# Patient Record
Sex: Male | Born: 1960 | Hispanic: No | Marital: Married | Smoking: Former smoker
Health system: Southern US, Community
[De-identification: ages and names within clinical notes are randomized; demographics above are authoritative.]

## PROBLEM LIST (undated history)

## (undated) ENCOUNTER — Ambulatory Visit (HOSPITAL_COMMUNITY): Admission: EM | Payer: Self-pay

## (undated) HISTORY — PX: WRIST SURGERY: SHX841

## (undated) HISTORY — PX: FRACTURE SURGERY: SHX138

---

## 1999-08-28 HISTORY — PX: PROSTATE SURGERY: SHX751

## 1999-10-09 ENCOUNTER — Ambulatory Visit (HOSPITAL_COMMUNITY): Admission: RE | Admit: 1999-10-09 | Discharge: 1999-10-09 | Payer: Self-pay | Admitting: Urology

## 2000-03-25 ENCOUNTER — Emergency Department (HOSPITAL_COMMUNITY): Admission: EM | Admit: 2000-03-25 | Discharge: 2000-03-25 | Payer: Self-pay

## 2002-05-29 ENCOUNTER — Emergency Department (HOSPITAL_COMMUNITY): Admission: EM | Admit: 2002-05-29 | Discharge: 2002-05-29 | Payer: Self-pay | Admitting: *Deleted

## 2011-06-04 ENCOUNTER — Other Ambulatory Visit: Payer: Self-pay | Admitting: Specialist

## 2011-06-04 ENCOUNTER — Ambulatory Visit
Admission: RE | Admit: 2011-06-04 | Discharge: 2011-06-04 | Disposition: A | Discharge status: Home / Self Care | Payer: No Typology Code available for payment source | Source: Ambulatory Visit | Attending: Specialist | Admitting: Specialist

## 2011-06-04 DIAGNOSIS — R6889 Other general symptoms and signs: Secondary | ICD-10-CM

## 2013-03-03 ENCOUNTER — Emergency Department (HOSPITAL_BASED_OUTPATIENT_CLINIC_OR_DEPARTMENT_OTHER): Payer: Worker's Compensation

## 2013-03-03 ENCOUNTER — Encounter (HOSPITAL_BASED_OUTPATIENT_CLINIC_OR_DEPARTMENT_OTHER): Payer: Self-pay | Admitting: *Deleted

## 2013-03-03 ENCOUNTER — Emergency Department (HOSPITAL_BASED_OUTPATIENT_CLINIC_OR_DEPARTMENT_OTHER)
Admission: EM | Admit: 2013-03-03 | Discharge: 2013-03-03 | Disposition: A | Discharge status: Home / Self Care | Payer: Worker's Compensation | Attending: Emergency Medicine | Admitting: Emergency Medicine

## 2013-03-03 DIAGNOSIS — S6990XA Unspecified injury of unspecified wrist, hand and finger(s), initial encounter: Secondary | ICD-10-CM | POA: Insufficient documentation

## 2013-03-03 DIAGNOSIS — T148XXA Other injury of unspecified body region, initial encounter: Secondary | ICD-10-CM

## 2013-03-03 DIAGNOSIS — S59909A Unspecified injury of unspecified elbow, initial encounter: Secondary | ICD-10-CM | POA: Insufficient documentation

## 2013-03-03 DIAGNOSIS — F17200 Nicotine dependence, unspecified, uncomplicated: Secondary | ICD-10-CM | POA: Insufficient documentation

## 2013-03-03 DIAGNOSIS — Y9289 Other specified places as the place of occurrence of the external cause: Secondary | ICD-10-CM | POA: Insufficient documentation

## 2013-03-03 DIAGNOSIS — S59919A Unspecified injury of unspecified forearm, initial encounter: Secondary | ICD-10-CM | POA: Insufficient documentation

## 2013-03-03 DIAGNOSIS — S6992XA Unspecified injury of left wrist, hand and finger(s), initial encounter: Secondary | ICD-10-CM

## 2013-03-03 DIAGNOSIS — Y9389 Activity, other specified: Secondary | ICD-10-CM | POA: Insufficient documentation

## 2013-03-03 DIAGNOSIS — S61409A Unspecified open wound of unspecified hand, initial encounter: Secondary | ICD-10-CM | POA: Insufficient documentation

## 2013-03-03 DIAGNOSIS — W241XXA Contact with transmission devices, not elsewhere classified, initial encounter: Secondary | ICD-10-CM | POA: Insufficient documentation

## 2013-03-03 DIAGNOSIS — Y990 Civilian activity done for income or pay: Secondary | ICD-10-CM | POA: Insufficient documentation

## 2013-03-03 MED ORDER — HYDROMORPHONE HCL PF 1 MG/ML IJ SOLN
1.0000 mg | Freq: Once | INTRAMUSCULAR | Status: AC
  Administered 2013-03-03: 1 mg via INTRAVENOUS
  Filled 2013-03-03: qty 1

## 2013-03-03 MED ORDER — BACITRACIN ZINC 500 UNIT/GM EX OINT: TOPICAL_OINTMENT | Freq: Two times a day (BID) | CUTANEOUS | Status: DC

## 2013-03-03 MED ORDER — OXYCODONE-ACETAMINOPHEN 5-325 MG PO TABS: 2.0000 | ORAL_TABLET | ORAL | Status: DC | PRN

## 2013-03-03 MED ORDER — BACITRACIN 500 UNIT/GM EX OINT
1.0000 "application " | TOPICAL_OINTMENT | Freq: Two times a day (BID) | CUTANEOUS | Status: DC
  Administered 2013-03-03: 1 via TOPICAL
  Filled 2013-03-03: qty 0.9

## 2013-03-03 NOTE — ED Provider Notes (Signed)
History    CSN: [id number] Arrival date & time 03/03/13  2002  First MD Initiated Contact with Patient 03/03/13 2039     Chief Complaint  Patient presents with  . Arm Injury   (Consider location/radiation/quality/duration/timing/severity/associated sxs/prior Treatment) Patient is a 52 y.o. male presenting with arm injury. The history is provided by the patient, medical records and a friend. No language interpreter was used.  Arm Injury Associated symptoms: no back pain, no fatigue and no fever     Adam Jacobs is a 52 y.o. male  with no medical history presents to the Emergency Department complaining of acute, persistent pain in the left hand, wrist and arm beginning approximately one hour ago after getting his left arm caught in a conveyor belt. Patient's employer states he bent over to pick up a box and uses left hand to steady himself was unaware that he was putting his hand on the belt. The belt pulled his hand down between the conveyor belt and the roller.  The employer states they stopped the belt and disassembled the roller to remove his hand. Associated symptoms include  searing pain in the left hand, although some of the skin of the left hand.  Nothing makes it better and the patient makes it worse.  Pt denies fever, chills, left elbow pain, left shoulder pain, neck pain, nausea, vomiting, loss of consciousness, neck or back pain.  Pt is right handed.     History reviewed. No pertinent past medical history. History reviewed. No pertinent past surgical history. No family history on file. History  Substance Use Topics  . Smoking status: Current Every Day Smoker -- 1.00 packs/day    Types: Cigarettes  . Smokeless tobacco: Not on file  . Alcohol Use: No    Review of Systems  Constitutional: Negative for fever, diaphoresis, appetite change, fatigue and unexpected weight change.  HENT: Negative for mouth sores and neck stiffness.   Eyes: Negative for visual disturbance.    Respiratory: Negative for cough, chest tightness, shortness of breath and wheezing.   Cardiovascular: Negative for chest pain.  Gastrointestinal: Negative for nausea, vomiting, abdominal pain, diarrhea and constipation.  Endocrine: Negative for polydipsia, polyphagia and polyuria.  Genitourinary: Negative for dysuria, urgency, frequency and hematuria.  Musculoskeletal: Positive for myalgias, joint swelling and arthralgias. Negative for back pain and gait problem.  Skin: Positive for wound. Negative for rash.  Allergic/Immunologic: Negative for immunocompromised state.  Neurological: Negative for syncope, light-headedness and headaches.  Hematological: Does not bruise/bleed easily.  Psychiatric/Behavioral: Negative for sleep disturbance. The patient is not nervous/anxious.     Allergies  Review of patient's allergies indicates no known allergies.  Home Medications   Current Outpatient Rx  Name  Route  Sig  Dispense  Refill  . bacitracin ointment   Topical   Apply topically 2 (two) times daily.   120 g   0   . oxyCODONE-acetaminophen (PERCOCET/ROXICET) 5-325 MG per tablet   Oral   Take 2 tablets by mouth every 4 (four) hours as needed for pain.   30 tablet   0    BP 130/104  Pulse 118  Temp(Src) 98 F (36.7 C) (Oral)  Resp 18  SpO2 100% Physical Exam  Nursing note and vitals reviewed. Constitutional: He is oriented to person, place, and time. He appears well-developed and well-nourished. No distress.  HENT:  Head: Normocephalic and atraumatic.  Eyes: Conjunctivae are normal.  Neck: Normal range of motion.  No cervical midline tenderness or paraspinal tenderness  Cardiovascular: Normal rate, regular rhythm, S1 normal, S2 normal, normal heart sounds and intact distal pulses.   Pulses:      Radial pulses are 2+ on the right side, and 2+ on the left side.       Dorsalis pedis pulses are 2+ on the right side, and 2+ on the left side.  Capillary refill < 3 sec   Pulmonary/Chest: Effort normal and breath sounds normal.  Musculoskeletal: He exhibits tenderness. He exhibits no edema.       Left shoulder: Normal.       Left elbow: Normal.       Left wrist: He exhibits decreased range of motion, tenderness, bony tenderness, swelling (mild) and laceration (avulsion).       Cervical back: Normal.       Thoracic back: Normal.       Lumbar back: Normal.       Arms:      Left hand: He exhibits decreased range of motion, tenderness, bony tenderness, laceration (avulsion) and swelling (mild). He exhibits normal two-point discrimination, normal capillary refill and no deformity. Normal sensation noted. Decreased strength noted.       Hands: ROM: Severely decreased range of motion of the left hand, fingers, wrist and forearm secondary to severe pain  Lymphadenopathy:    He has no cervical adenopathy.  Neurological: He is alert and oriented to person, place, and time. He exhibits normal muscle tone. Coordination normal. GCS eye subscore is 4. GCS verbal subscore is 5. GCS motor subscore is 6.  Sensation intact to dull and sharp in bilateral upper extremities Strength 1/5 secondary to severe pain in the LLE  Skin: Skin is warm and dry. He is not diaphoretic.  No tenting of the skin Avulsion of a large portion of the anterior and lateral wrist extending up to the left thumb and down into the forearm without visible tendons, no saturable laceration   Psychiatric: He has a normal mood and affect.    ED Course  Procedures (including critical care time) Labs Reviewed - No data to display Dg Forearm Left  03/03/2013   *RADIOLOGY REPORT*  Clinical Data: Left arm injury, arm was caught in a conveyor belt. Left wrist was mashed.  Skin avulsions to the thumb, wrist, and forearm.  Pain.  LEFT FOREARM - 2 VIEW  Comparison: None.  Findings: The left radius and ulna appear intact. No evidence of acute fracture or subluxation.  No focal bone lesions.  Bone matrix and cortex  appear intact.  No abnormal radiopaque densities in the soft tissues.  Soft tissue gas collections over the ulnar aspect of the wrist.  IMPRESSION: No displaced fractures demonstrated in the left radius or ulna.   Original Report Authenticated By: Burman Nieves, M.D.   Dg Wrist Complete Left  03/03/2013   *RADIOLOGY REPORT*  Clinical Data: Arm injury  LEFT WRIST - COMPLETE 3+ VIEW  Comparison: 03/03/2013  Findings: There is no evidence of fracture or dislocation.  There is no evidence of arthropathy or other focal bone abnormality. Soft tissues are unremarkable.  IMPRESSION:  1.  No acute findings.   Original Report Authenticated By: Signa Kell, M.D.   Dg Hand Complete Left  03/03/2013   *RADIOLOGY REPORT*  Clinical Data: Arm injury  LEFT HAND - COMPLETE 3+ VIEW  Comparison: None  Findings: There is no evidence of fracture or dislocation.  There is no evidence of arthropathy or other focal bone abnormality. Soft tissues are unremarkable.  IMPRESSION: Negative  exam.   Original Report Authenticated By: Signa Kell, M.D.   1. Hand injury, left, initial encounter   2. Skin avulsion     MDM  Joakim Jacobs presents with crush injury to the left arm. Patient right handed. Skin avulsion with no suturable laceration.  X-rays without acute fracture or dislocation noted in the left wrist, left hand and left forearm.  I personally reviewed the imaging tests through PACS system.  I reviewed available ER/hospitalization records through the EMR.  Pt avulsion cleaned and bandaged with bacitracin.  Wrist splint placed and pt given instructions to follow-up with Hand surgery this week for further evaluation.  Pt counseled to watch for possible compartment syndrome and return to the ED for worsening symptoms.  I have also discussed reasons to return immediately to the ER.  Patient expresses understanding and agrees with plan.   Dr. Karma Ganja was consulted and agrees with the plan.     Dahlia Client Payslee Bateson,  PA-C 03/03/13 2312

## 2013-03-03 NOTE — ED Notes (Signed)
PA at bedside now  

## 2013-03-03 NOTE — ED Notes (Signed)
Per pt and pt's supervisor at bedside, pt incidentally caught his left hand in a conveyor belt between the roller and the belt, pt sustained a superificial avulsion injury to left anterior hand/wrist - drainage from wound is minimal serosanguinous fluid, saline-moistened guaze applied to injury site, unable to cleanse wound at present d/t pain.
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
## 2013-03-03 NOTE — ED Provider Notes (Signed)
Medical screening examination/treatment/procedure(s) were performed by non-physician practitioner and as supervising physician I was immediately available for consultation/collaboration.  Natassja Ollis K Linker, MD 03/03/13 2329 

## 2014-04-05 IMAGING — CR DG WRIST COMPLETE 3+V*L*
4 series · 4 of 4 positions shown · non-contrast
Comparison: 03/03/2013

CLINICAL DATA: Arm injury

LEFT WRIST - COMPLETE 3+ VIEW

[x wrist pa left]
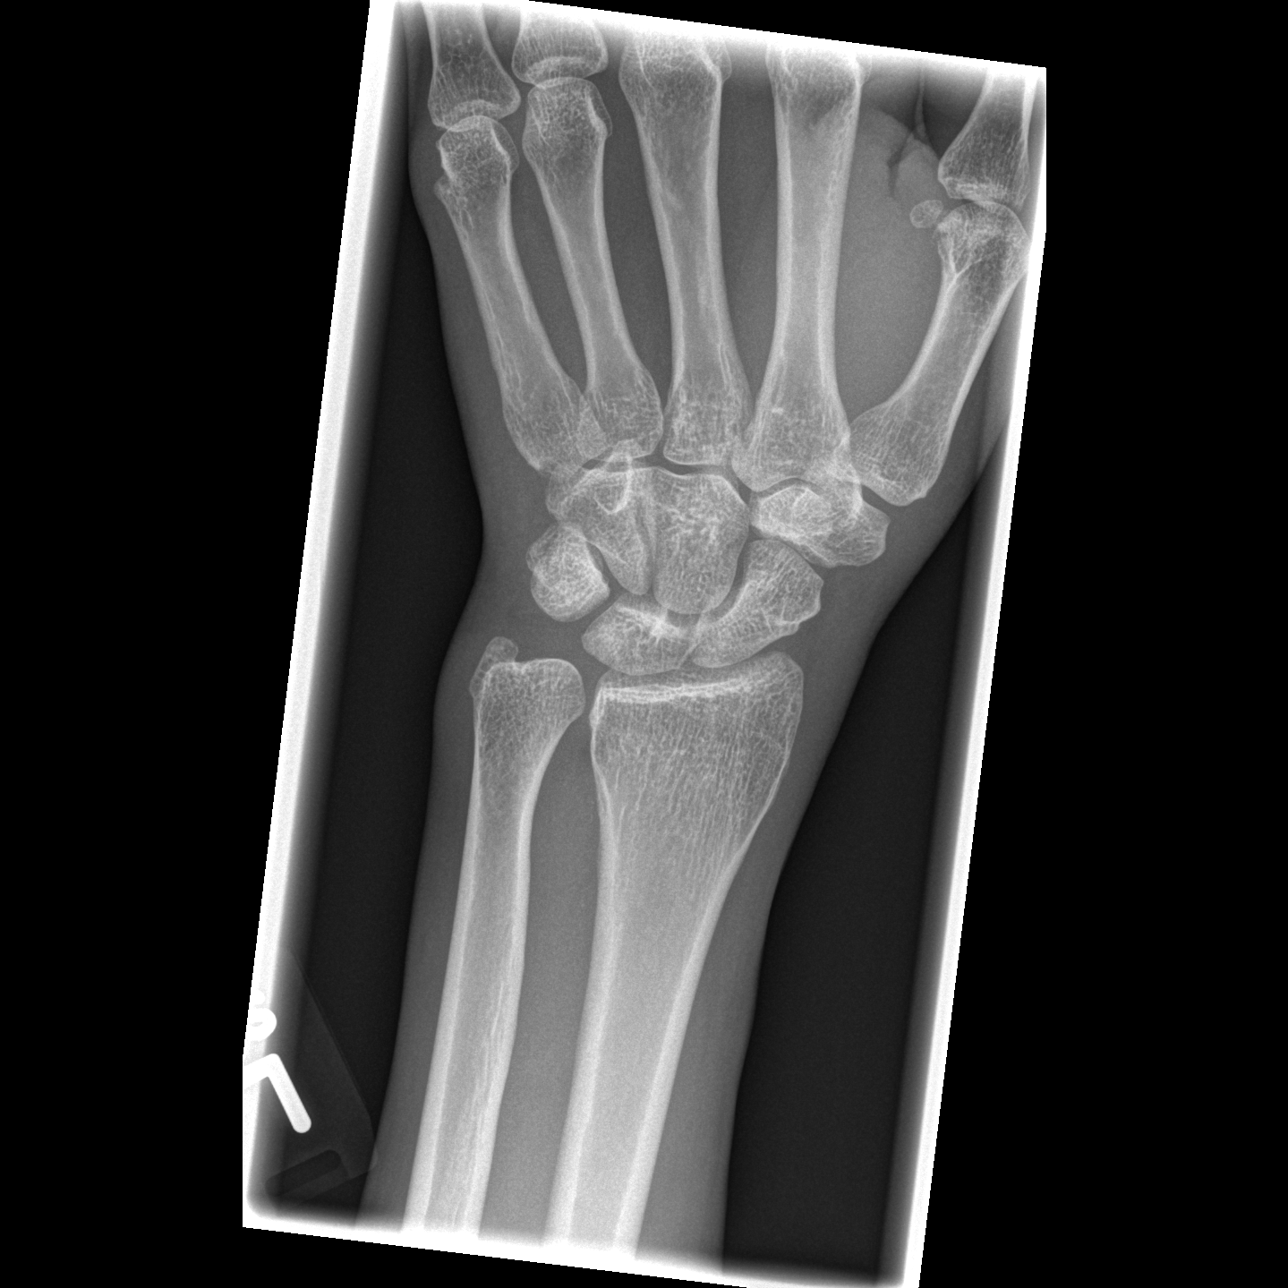

[x wrist obl left]
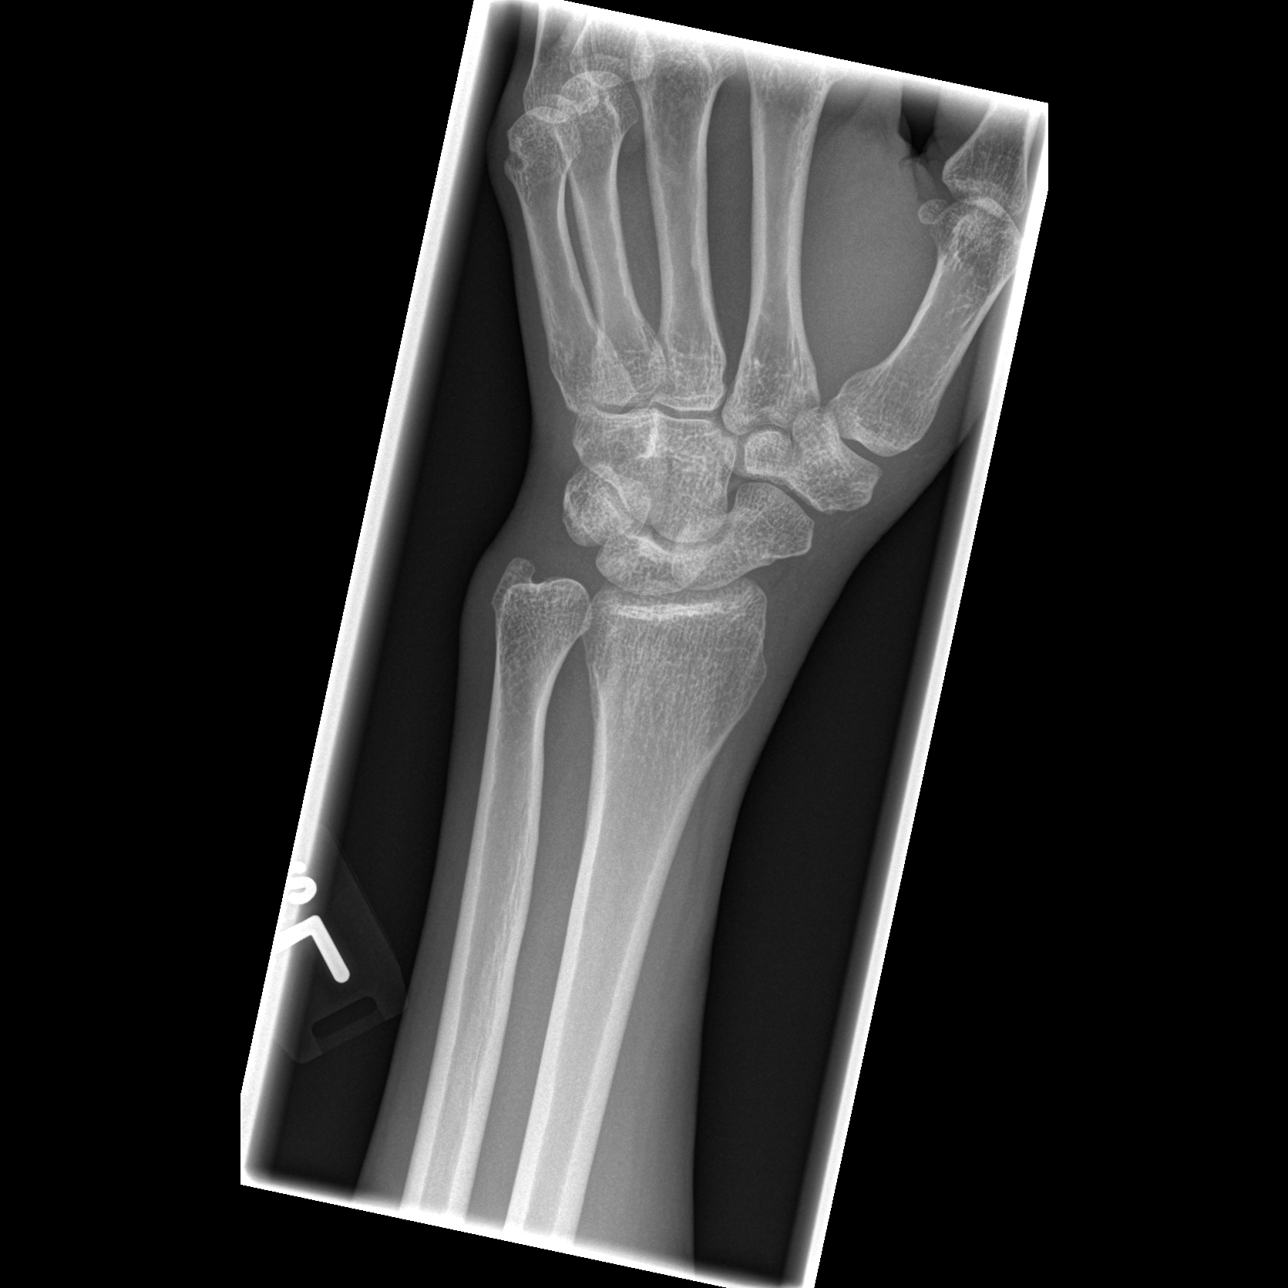

[x wrist lat left]
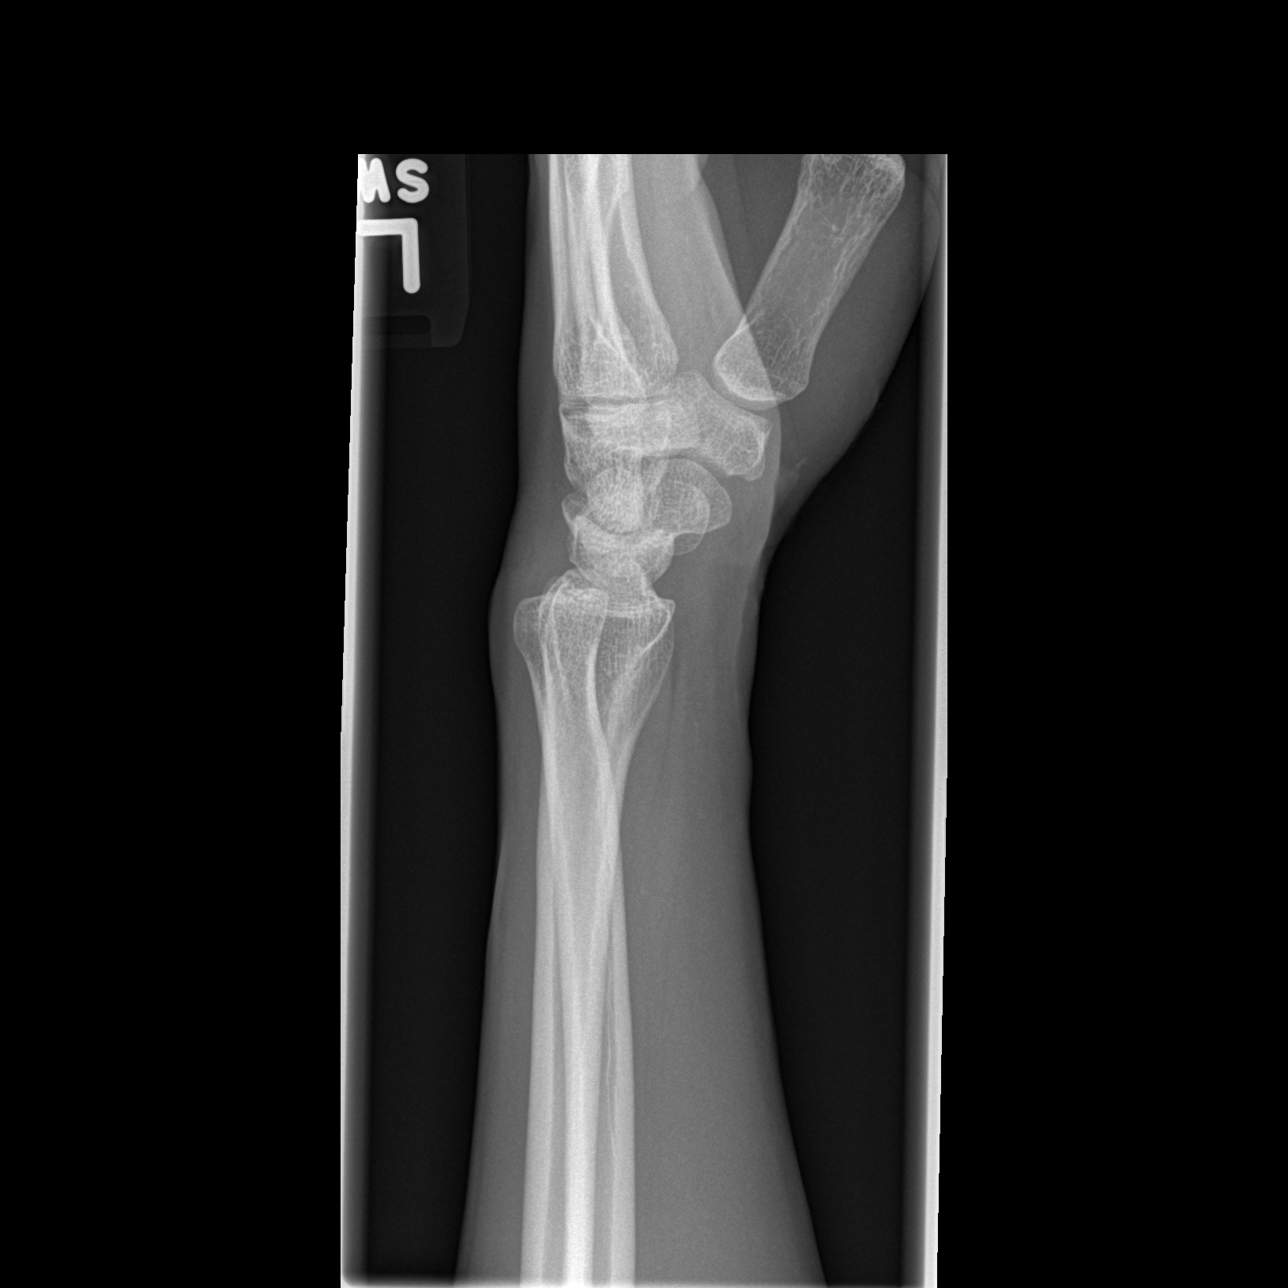

[x navicular]
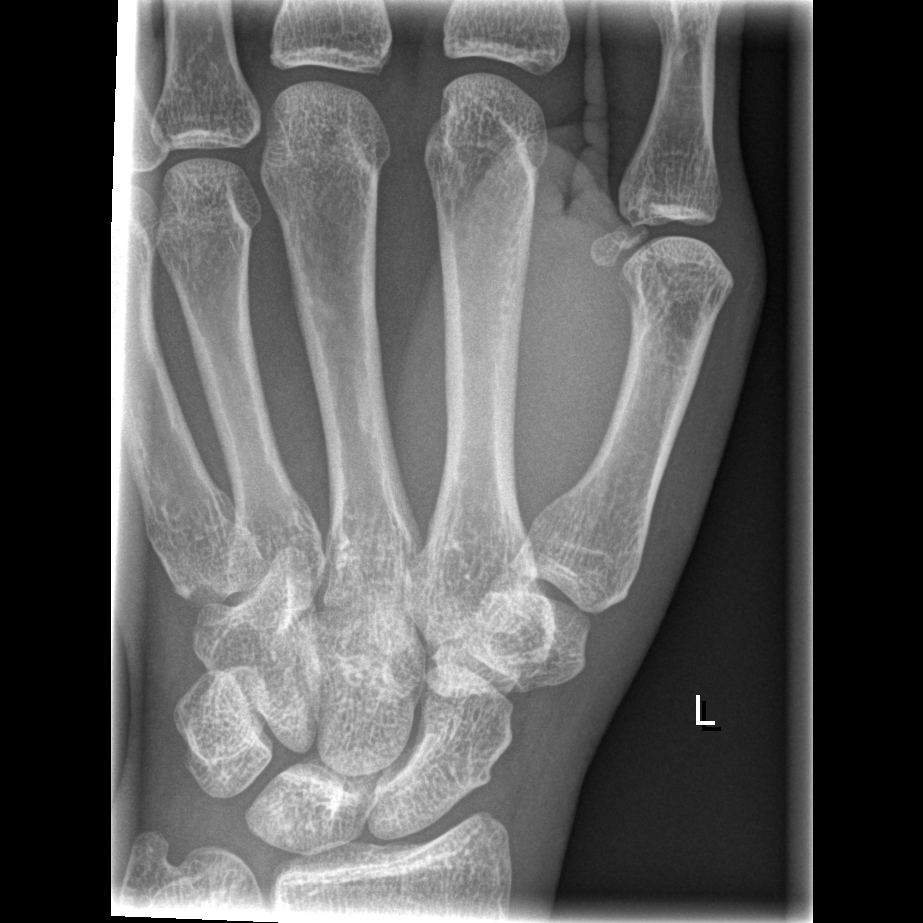

[4 of 4 positions shown; findings below may reference images not displayed]

FINDINGS: There is no evidence of fracture or dislocation.  There
is no evidence of arthropathy or other focal bone abnormality.
Soft tissues are unremarkable.
IMPRESSION: 1.  No acute findings.

## 2016-04-29 ENCOUNTER — Emergency Department (HOSPITAL_COMMUNITY): Payer: No Typology Code available for payment source

## 2016-04-29 ENCOUNTER — Emergency Department (HOSPITAL_COMMUNITY)
Admission: EM | Admit: 2016-04-29 | Discharge: 2016-04-29 | Disposition: A | Discharge status: Home / Self Care | Payer: No Typology Code available for payment source | Attending: Emergency Medicine | Admitting: Emergency Medicine

## 2016-04-29 ENCOUNTER — Encounter (HOSPITAL_COMMUNITY): Payer: Self-pay

## 2016-04-29 DIAGNOSIS — S61412A Laceration without foreign body of left hand, initial encounter: Secondary | ICD-10-CM | POA: Diagnosis not present

## 2016-04-29 DIAGNOSIS — Y92410 Unspecified street and highway as the place of occurrence of the external cause: Secondary | ICD-10-CM | POA: Insufficient documentation

## 2016-04-29 DIAGNOSIS — S80212A Abrasion, left knee, initial encounter: Secondary | ICD-10-CM | POA: Diagnosis not present

## 2016-04-29 DIAGNOSIS — Y939 Activity, unspecified: Secondary | ICD-10-CM | POA: Diagnosis not present

## 2016-04-29 DIAGNOSIS — S80211A Abrasion, right knee, initial encounter: Secondary | ICD-10-CM | POA: Insufficient documentation

## 2016-04-29 DIAGNOSIS — Y999 Unspecified external cause status: Secondary | ICD-10-CM | POA: Diagnosis not present

## 2016-04-29 DIAGNOSIS — F17210 Nicotine dependence, cigarettes, uncomplicated: Secondary | ICD-10-CM | POA: Diagnosis not present

## 2016-04-29 DIAGNOSIS — S91312A Laceration without foreign body, left foot, initial encounter: Secondary | ICD-10-CM | POA: Insufficient documentation

## 2016-04-29 DIAGNOSIS — S6992XA Unspecified injury of left wrist, hand and finger(s), initial encounter: Secondary | ICD-10-CM | POA: Diagnosis present

## 2016-04-29 MED ORDER — KETOROLAC TROMETHAMINE 30 MG/ML IJ SOLN
30.0000 mg | Freq: Once | INTRAMUSCULAR | Status: AC
  Administered 2016-04-29: 30 mg via INTRAMUSCULAR
  Filled 2016-04-29: qty 1

## 2016-04-29 NOTE — ED Notes (Signed)
Bandaged patients wounds.

## 2016-04-29 NOTE — ED Notes (Signed)
The pt went to xray and returned
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
## 2016-04-29 NOTE — Discharge Instructions (Signed)
As discussed, your evaluation today has been largely reassuring.  But, it is important that you monitor your condition carefully, and do not hesitate to return to the ED if you develop new, or concerning changes in your condition. ? ?Otherwise, please follow-up with your physician for appropriate ongoing care. ? ?
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
## 2016-04-29 NOTE — ED Notes (Signed)
Pt resting now  Preparing for discharge

## 2016-04-29 NOTE — ED Notes (Signed)
The pt refused to get  An injection for pain med returned to stock
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
## 2016-04-29 NOTE — ED Notes (Signed)
Patient transported to X-ray 

## 2016-05-09 ENCOUNTER — Emergency Department (HOSPITAL_COMMUNITY)
Admission: EM | Admit: 2016-05-09 | Discharge: 2016-05-09 | Disposition: A | Discharge status: Home / Self Care | Payer: Self-pay | Attending: Emergency Medicine | Admitting: Emergency Medicine

## 2016-05-09 ENCOUNTER — Encounter (HOSPITAL_COMMUNITY): Payer: Self-pay | Admitting: *Deleted

## 2016-05-09 DIAGNOSIS — F17210 Nicotine dependence, cigarettes, uncomplicated: Secondary | ICD-10-CM | POA: Insufficient documentation

## 2016-05-09 DIAGNOSIS — L0201 Cutaneous abscess of face: Secondary | ICD-10-CM | POA: Insufficient documentation

## 2016-05-09 MED ORDER — LIDOCAINE-EPINEPHRINE (PF) 2 %-1:200000 IJ SOLN
10.0000 mL | Freq: Once | INTRAMUSCULAR | Status: AC
  Administered 2016-05-09: 10 mL
  Filled 2016-05-09: qty 20

## 2016-05-09 MED ORDER — TRAMADOL HCL 50 MG PO TABS: 50.0000 mg | ORAL_TABLET | Freq: Four times a day (QID) | ORAL | 0 refills | Status: DC | PRN
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
## 2016-05-09 NOTE — Discharge Instructions (Signed)
Take acetaminophen or ibuprofen as needed for pain. Take tramadol for pain not relieved by the other medication.

## 2016-05-09 NOTE — ED Notes (Signed)
Suture cart at bedside with lidocaine, EDP made aware. Patient updated on POC.

## 2016-05-09 NOTE — ED Notes (Signed)
Wound and patient cleaned, patient given extra supplies for home dressing changes.

## 2016-07-28 DIAGNOSIS — L02811 Cutaneous abscess of head [any part, except face]: Secondary | ICD-10-CM | POA: Insufficient documentation

## 2016-07-28 DIAGNOSIS — J392 Other diseases of pharynx: Secondary | ICD-10-CM | POA: Insufficient documentation
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
## 2019-06-20 ENCOUNTER — Ambulatory Visit: Payer: Self-pay | Admitting: Internal Medicine

## 2019-06-27 ENCOUNTER — Telehealth: Payer: Self-pay | Admitting: Internal Medicine

## 2019-06-27 ENCOUNTER — Ambulatory Visit: Payer: Self-pay | Admitting: Internal Medicine

## 2019-06-27 ENCOUNTER — Other Ambulatory Visit: Payer: Self-pay

## 2019-06-27 DIAGNOSIS — L02811 Cutaneous abscess of head [any part, except face]: Secondary | ICD-10-CM

## 2019-06-27 DIAGNOSIS — J392 Other diseases of pharynx: Secondary | ICD-10-CM

## 2019-06-27 NOTE — Progress Notes (Unsigned)
Acute Office Visit  Subjective:    Patient ID: Adam Jacobs, male    DOB: 10/21/1960, 58 y.o.   MRN: [id number]  No chief complaint on file.   HPI Patient is in today for ***  No past medical history on file.  No past surgical history on file.  No family history on file.  Social History   Socioeconomic History  . Marital status: Married    Spouse name: Not on file  . Number of children: Not on file  . Years of education: Not on file  . Highest education level: Not on file  Occupational History  . Not on file  Social Needs  . Financial resource strain: Not on file  . Food insecurity    Worry: Not on file    Inability: Not on file  . Transportation needs    Medical: Not on file    Non-medical: Not on file  Tobacco Use  . Smoking status: Current Every Day Smoker    Packs/day: 1.00    Types: Cigarettes  . Smokeless tobacco: Never Used  Substance and Sexual Activity  . Alcohol use: No  . Drug use: No  . Sexual activity: Not on file  Lifestyle  . Physical activity    Days per week: Not on file    Minutes per session: Not on file  . Stress: Not on file  Relationships  . Social Herbalist on phone: Not on file    Gets together: Not on file    Attends religious service: Not on file    Active member of club or organization: Not on file    Attends meetings of clubs or organizations: Not on file    Relationship status: Not on file  . Intimate partner violence    Fear of current or ex partner: Not on file    Emotionally abused: Not on file    Physically abused: Not on file    Forced sexual activity: Not on file  Other Topics Concern  . Not on file  Social History Narrative  . Not on file    Outpatient Medications Prior to Visit  Medication Sig Dispense Refill  . traMADol (ULTRAM) 50 MG tablet Take 1 tablet (50 mg total) by mouth every 6 (six) hours as needed. 15 tablet 0   No facility-administered medications prior to visit.     No Known  Allergies  ROS     Objective:    Physical Exam  There were no vitals taken for this visit. Wt Readings from Last 3 Encounters:  07/28/16 124 lb 6.4 oz (56.4 kg)  05/09/16 120 lb (54.4 kg)  04/29/16 120 lb (54.4 kg)    Health Maintenance Due  Topic Date Due  . Hepatitis C Screening  09-19-1960  . HIV Screening  08/28/1975  . TETANUS/TDAP  08/28/1979  . COLONOSCOPY  08/27/2010  . INFLUENZA VACCINE  03/28/2019    There are no preventive care reminders to display for this patient.   No results found for: TSH No results found for: WBC, HGB, HCT, MCV, PLT No results found for: NA, K, CHLORIDE, CO2, GLUCOSE, BUN, CREATININE, BILITOT, ALKPHOS, AST, ALT, PROT, ALBUMIN, CALCIUM, ANIONGAP, EGFR, GFR No results found for: CHOL No results found for: HDL No results found for: LDLCALC No results found for: TRIG No results found for: CHOLHDL No results found for: HGBA1C     Assessment & Plan:   Problem List Items Addressed This Visit  None       No orders of the defined types were placed in this encounter.    Wallene Huh, MD

## 2019-07-03 DIAGNOSIS — K5900 Constipation, unspecified: Secondary | ICD-10-CM | POA: Insufficient documentation

## 2019-07-03 DIAGNOSIS — W19XXXA Unspecified fall, initial encounter: Secondary | ICD-10-CM | POA: Insufficient documentation

## 2019-07-03 DIAGNOSIS — R109 Unspecified abdominal pain: Secondary | ICD-10-CM | POA: Insufficient documentation

## 2019-07-03 DIAGNOSIS — K649 Unspecified hemorrhoids: Secondary | ICD-10-CM | POA: Insufficient documentation

## 2019-07-03 DIAGNOSIS — R6889 Other general symptoms and signs: Secondary | ICD-10-CM | POA: Insufficient documentation

## 2019-07-03 NOTE — Progress Notes (Signed)
From a call to patient on 07/03/2019: Patient mentioned having a previous laser operation for urine problems in the past but thinks he might need another one. He also mentioned that he was taking a cream for his hemorrhoids but stopped taking it two days ago from today (so around 07/01/19). He also mentioned having an allergy treatment in the past for his face because of swelling.

## 2019-07-04 ENCOUNTER — Ambulatory Visit: Payer: Self-pay | Admitting: Family Medicine

## 2019-07-04 ENCOUNTER — Telehealth: Payer: Self-pay | Admitting: Family Medicine

## 2019-07-04 DIAGNOSIS — W19XXXA Unspecified fall, initial encounter: Secondary | ICD-10-CM

## 2019-07-04 DIAGNOSIS — K5900 Constipation, unspecified: Secondary | ICD-10-CM

## 2019-07-04 DIAGNOSIS — R6889 Other general symptoms and signs: Secondary | ICD-10-CM

## 2019-07-04 DIAGNOSIS — R221 Localized swelling, mass and lump, neck: Secondary | ICD-10-CM

## 2019-07-04 DIAGNOSIS — K649 Unspecified hemorrhoids: Secondary | ICD-10-CM

## 2019-07-04 DIAGNOSIS — R109 Unspecified abdominal pain: Secondary | ICD-10-CM

## 2019-07-04 NOTE — Patient Instructions (Signed)

## 2019-07-04 NOTE — Progress Notes (Signed)
S. Neck mass left side for years  hemorrhoids for 1 years getting worse O. He is not in any SOB, no respiratory effort Neck: left side there is 2.5 cm approxmitly left side A/p Left side mass of neck H/o hemorrhoids  Plan: surgical consult CBC, CMP

## 2019-07-13 ENCOUNTER — Other Ambulatory Visit: Payer: Self-pay | Admitting: Family Medicine

## 2019-07-14 LAB — CBC WITH DIFFERENTIAL/PLATELET
Basophils Absolute: 0 10*3/uL (ref 0.0–0.2)
Basos: 1 %
EOS (ABSOLUTE): 0.1 10*3/uL (ref 0.0–0.4)
Eos: 4 %
Hematocrit: 43.4 % (ref 37.5–51.0)
Hemoglobin: 15 g/dL (ref 13.0–17.7)
Immature Grans (Abs): 0 10*3/uL (ref 0.0–0.1)
Immature Granulocytes: 0 %
Lymphocytes Absolute: 1.9 10*3/uL (ref 0.7–3.1)
Lymphs: 57 %
MCH: 30.2 pg (ref 26.6–33.0)
MCHC: 34.6 g/dL (ref 31.5–35.7)
MCV: 88 fL (ref 79–97)
Monocytes Absolute: 0.4 10*3/uL (ref 0.1–0.9)
Monocytes: 11 %
Neutrophils Absolute: 0.9 10*3/uL — ABNORMAL LOW (ref 1.4–7.0)
Neutrophils: 27 %
Platelets: 233 10*3/uL (ref 150–450)
RBC: 4.96 x10E6/uL (ref 4.14–5.80)
RDW: 13.1 % (ref 11.6–15.4)
WBC: 3.4 10*3/uL (ref 3.4–10.8)

## 2019-07-14 LAB — COMPREHENSIVE METABOLIC PANEL
ALT: 20 IU/L (ref 0–44)
AST: 24 IU/L (ref 0–40)
Albumin/Globulin Ratio: 1.8 (ref 1.2–2.2)
Albumin: 4.5 g/dL (ref 3.8–4.9)
Alkaline Phosphatase: 66 IU/L (ref 39–117)
BUN/Creatinine Ratio: 16 (ref 9–20)
BUN: 21 mg/dL (ref 6–24)
Bilirubin Total: 0.5 mg/dL (ref 0.0–1.2)
CO2: 23 mmol/L (ref 20–29)
Calcium: 9.3 mg/dL (ref 8.7–10.2)
Chloride: 100 mmol/L (ref 96–106)
Creatinine, Ser: 1.29 mg/dL — ABNORMAL HIGH (ref 0.76–1.27)
GFR calc Af Amer: 70 mL/min/{1.73_m2} (ref >59)
GFR calc non Af Amer: 61 mL/min/{1.73_m2} (ref >59)
Globulin, Total: 2.5 g/dL (ref 1.5–4.5)
Glucose: 92 mg/dL (ref 65–99)
Potassium: 4.7 mmol/L (ref 3.5–5.2)
Sodium: 138 mmol/L (ref 134–144)
Total Protein: 7 g/dL (ref 6.0–8.5)

## 2019-07-16 ENCOUNTER — Other Ambulatory Visit: Payer: Self-pay

## 2019-07-16 ENCOUNTER — Encounter: Payer: Self-pay | Admitting: General Surgery

## 2019-07-16 ENCOUNTER — Ambulatory Visit (INDEPENDENT_AMBULATORY_CARE_PROVIDER_SITE_OTHER): Payer: Self-pay | Admitting: General Surgery

## 2019-07-16 VITALS — BP 152/91 | HR 85 | Temp 97.2°F | Resp 12 | Ht 68.0 in | Wt 128.0 lb

## 2019-07-16 DIAGNOSIS — R221 Localized swelling, mass and lump, neck: Secondary | ICD-10-CM

## 2019-07-16 NOTE — Patient Instructions (Addendum)
We will call you with the results of the Ultrasound.   Referral sent to [facility] and someone from their office will call you within 7 days to schedule the appointment.   Ultrasound scheduled 07/24/2019 @ 2:15 pm at [facility].

## 2019-07-16 NOTE — Progress Notes (Signed)
Patient ID: Adam Jacobs, male   DOB: 04/19/1961, 58 y.o.   MRN: [id number]  Chief Complaint  Patient presents with  . New Patient (Initial Visit)    Hemorrhoids    HPI Adam Jacobs is a 58 y.o. male.   He was referred by his primary care provider, Dr. Kearney Hard,  for evaluation of hemorrhoids.  [name] states that he thought he was here for a soft tissue mass on his left jaw.  In regards to his hemorrhoids, however, he says that he has had hemorrhoids for many years.  He has had some intermittent bleeding for about the last 4 years.  He says that he has significant difficulty with constipation.  When he has bleeding, it is primarily with bowel movements.  He denies any burning or itching.  He has never had a thrombosed hemorrhoid.  He says that he occasionally has some pain but only when he eats spicy foods.  He is currently using Preparation H.  He has never had a colonoscopy.  I also asked him about the mass on his jaw.  He says that it has been there for a very long time and seems to be getting bigger.  It is nontender.  It never becomes hot, hard, or warm, nor does it ever drain any fluid or pus.  He denies any salty or bitter taste in his mouth.  He is bothered by its physical presence, however.   History reviewed. No pertinent past medical history.  Past Surgical History:  Procedure Laterality Date  . WRIST SURGERY    He also apparently had laser surgery on his penis in 2001.  History reviewed. No pertinent family history.  Social History Social History   Tobacco Use  . Smoking status: Former Smoker    Packs/day: 1.00    Types: Cigarettes    Quit date: 2019    Years since quitting: 1.8  . Smokeless tobacco: Never Used  Substance Use Topics  . Alcohol use: No  . Drug use: No    No Known Allergies  No current outpatient medications on file.   No current facility-administered medications for this visit.     Review of Systems Review of Systems  All other systems  reviewed and are negative.   Blood pressure (!) 152/91, pulse 85, temperature (!) 97.2 F (36.2 C), temperature source Temporal, resp. rate 12, height 5\' 8"  (1.727 m), weight 128 lb (58.1 kg), SpO2 100 %.  Physical Exam Physical Exam Vitals signs reviewed. Exam conducted with a chaperone present.  Constitutional:      General: He is not in acute distress.    Appearance: Normal appearance. He is normal weight.  HENT:     Head: Normocephalic and atraumatic.     Comments: There is a soft mobile well-circumscribed mass along the angle of the left mandible.  It is nontender and is not fixed to any of the underlying or overlying tissues.  It is approximately 8 cm in greatest dimension.    Nose: Nose normal.     Mouth/Throat:     Mouth: Mucous membranes are moist.     Pharynx: Oropharynx is clear. No oropharyngeal exudate.  Eyes:     General: No scleral icterus.       Right eye: No discharge.        Left eye: No discharge.  Cardiovascular:     Rate and Rhythm: Normal rate and regular rhythm.     Pulses: Normal pulses.  Pulmonary:  Effort: Pulmonary effort is normal.     Breath sounds: Normal breath sounds.  Abdominal:     General: Abdomen is flat. Bowel sounds are normal. There is no distension.     Palpations: Abdomen is soft.     Tenderness: There is no abdominal tenderness.  Genitourinary:    Rectum: External hemorrhoid present.       Comments: There are some skin tags present, likely secondary to prior episodes of hemorrhoidal disease.  There are small, nonbleeding, nonprolapsed external hemorrhoids. Musculoskeletal:        General: No swelling or deformity.     Comments: There is a scar with depigmented skin on his left wrist, secondary to surgical repair of an injury.  Lymphadenopathy:     Cervical: No cervical adenopathy.  Skin:    General: Skin is warm and dry.  Neurological:     General: No focal deficit present.     Mental Status: He is alert and oriented to  person, place, and time.  Psychiatric:        Mood and Affect: Mood normal.        Behavior: Behavior normal.     Data Reviewed There were no relevant data available for review.  Assessment This is a 58 year old man who has a history of hemorrhoids.  He has had some intermittent bleeding, but no pain, itching, or burning.  On physical examination, the hemorrhoids are quite small and I told [name] that I did not think surgical intervention was warranted at this point.  Certainly, if they become larger or he has more symptomatic episodes of blood loss or pain, we could reconsider this.  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  He would like to have it removed if this is feasible.  Plan We placed a referral to [facility] so that [name] could obtain a colonoscopy; he is well overdue for this test.  I have also ordered imaging of the mass on his jaw to better characterize the nature of the lesion.  I will contact the patient with the results of the study and discuss whether or not surgical intervention is warranted and feasible.  For now, he will continue to treat his hemorrhoids on a symptomatic basis.    Duanne Guess 07/16/2019, 3:59 PM

## 2019-07-24 ENCOUNTER — Other Ambulatory Visit: Payer: Self-pay

## 2019-07-24 ENCOUNTER — Ambulatory Visit
Admission: RE | Admit: 2019-07-24 | Discharge: 2019-07-24 | Disposition: A | Discharge status: Home / Self Care | Payer: Self-pay | Source: Ambulatory Visit | Attending: General Surgery | Admitting: General Surgery

## 2019-07-24 DIAGNOSIS — R221 Localized swelling, mass and lump, neck: Secondary | ICD-10-CM | POA: Insufficient documentation

## 2019-07-28 ENCOUNTER — Encounter: Payer: Self-pay | Admitting: *Deleted

## 2019-08-03 ENCOUNTER — Other Ambulatory Visit: Payer: Self-pay

## 2019-08-03 ENCOUNTER — Other Ambulatory Visit: Payer: Self-pay | Admitting: General Surgery

## 2019-08-03 ENCOUNTER — Encounter
Admission: RE | Admit: 2019-08-03 | Discharge: 2019-08-03 | Disposition: A | Discharge status: Home / Self Care | Payer: Self-pay | Source: Ambulatory Visit | Attending: General Surgery | Admitting: General Surgery

## 2019-08-03 ENCOUNTER — Telehealth: Payer: Self-pay | Admitting: General Surgery

## 2019-08-03 DIAGNOSIS — R221 Localized swelling, mass and lump, neck: Secondary | ICD-10-CM

## 2019-08-03 NOTE — Telephone Encounter (Signed)
Pt has been advised of pre admission date/time, Covid Testing date and Surgery date.  Surgery Date: 08/10/19 with Dr Lacretia Leigh of lipoma-mass-jaw. Preadmission Testing Date: 08/03/19 between 8-1:00am-phone interview.  Covid Testing Date: 08/06/19 between 8-10:30am - patient advised to go to the [facility]  [employer] Video sent via [employer] [facility] and [employer].  Patient has been made aware to call [phone], between 1-3:00pm the day before surgery, to find out what time to arrive.

## 2019-08-03 NOTE — Patient Instructions (Addendum)
Your COVID swab is scheduled on: Thursday 08/06/2019. Drive up 2:95-28:41 am in front of the [facility] and stay in your vehicle.  Your procedure is scheduled on: Monday 08/10/2019 Report to Same Day Surgery 2nd floor [facility] Entrance-take elevator on left to 2nd floor.  Check in with surgery information desk.) To find out your arrival time, call [phone] 1:00-3:00 PM on Friday 08/07/2019  Remember: Instructions that are not followed completely may result in serious medical risk, up to and including death, or upon the discretion of your surgeon and anesthesiologist your surgery may need to be rescheduled.    __x__ 1. Do not eat food (including mints, candies, chewing gum) after midnight the night before your procedure. You may drink water up to 2 hours before you are scheduled to arrive at the hospital for your procedure.      __x__ 2. No Alcohol for 24 hours before or after surgery.   __x__ 3. No Smoking or e-cigarettes for 24 hours before surgery.  Do not use any chewable tobacco products for at least 6 hours before surgery.   __x__ 4. Notify your doctor if there is any change in your medical condition (cold, fever, infections).   __x__ 5. On the morning of surgery brush your teeth with toothpaste and water.  You may rinse your mouth with mouthwash if you wish.  Do not swallow any toothpaste or mouthwash.  Please read over the following fact sheets that you were given:   [facility] Preparing for Surgery and/or MRSA Information    __x__ Use CHG Soap as directed on instruction sheet.   Do not wear jewelry, lotions, powders, deodorant, or perfumes on the day of surgery.  Do not shave below the face/neck 48 hours prior to surgery.   Do not bring valuables to the hospital.    [facility] is not responsible for any belongings or valuables.               Contacts, eyeglasses, dentures or bridgework may not be worn into surgery.  For patients discharged on the  day of surgery, you will NOT be permitted to drive yourself home.  You must have a responsible adult with you for 24 hours after surgery.  __x__ Take these medicines on the morning of surgery with a SMALL SIP OF WATER:  1. NONE  __x__ Follow recommendations from Cardiologist, Pulmonologist or PCP regarding stopping blood thinners such as Aspirin, Coumadin, Plavix, Eliquis, Effient, Pradaxa, and Pletal.  __x__ STARTING TODAY: Do not take any Anti-inflammatories such as Advil, Ibuprofen, Motrin, Aleve, Naproxen, Naprosyn, BC/Goodies powders or aspirin products. You take Tylenol if needed.   __x__ STARTING TODAY: Do not take any over the counter supplements until after surgery.   Instructions reviewed with patient via telephone.  Patient verbalized understanding, and will receive printed copy of instructions at [place] swab appointment.   ______________________________  08/03/2019  2:23 PM

## 2019-08-04 ENCOUNTER — Other Ambulatory Visit: Payer: Self-pay

## 2019-08-06 ENCOUNTER — Other Ambulatory Visit: Payer: Self-pay

## 2019-08-06 ENCOUNTER — Other Ambulatory Visit
Admission: RE | Admit: 2019-08-06 | Discharge: 2019-08-06 | Disposition: A | Discharge status: Home / Self Care | Payer: Self-pay | Source: Ambulatory Visit | Attending: General Surgery | Admitting: General Surgery

## 2019-08-06 DIAGNOSIS — Z01812 Encounter for preprocedural laboratory examination: Secondary | ICD-10-CM | POA: Insufficient documentation

## 2019-08-06 DIAGNOSIS — Z20828 Contact with and (suspected) exposure to other viral communicable diseases: Secondary | ICD-10-CM | POA: Insufficient documentation

## 2019-08-06 LAB — SARS CORONAVIRUS 2 (TAT 6-24 HRS): SARS Coronavirus 2: NEGATIVE

## 2019-08-10 ENCOUNTER — Encounter: Admission: RE | Payer: Self-pay | Source: Home / Self Care

## 2019-08-10 ENCOUNTER — Ambulatory Visit: Admission: RE | Admit: 2019-08-10 | Payer: Self-pay | Source: Home / Self Care | Admitting: General Surgery

## 2019-08-10 SURGERY — EXCISION, MASS, NECK
Anesthesia: General | Laterality: Left

## 2019-08-25 ENCOUNTER — Ambulatory Visit: Payer: Self-pay | Attending: Internal Medicine

## 2019-08-25 DIAGNOSIS — Z20822 Contact with and (suspected) exposure to covid-19: Secondary | ICD-10-CM

## 2019-08-25 DIAGNOSIS — U071 COVID-19: Secondary | ICD-10-CM | POA: Insufficient documentation

## 2019-08-26 LAB — NOVEL CORONAVIRUS, NAA: SARS-CoV-2, NAA: DETECTED — AB

## 2019-09-24 ENCOUNTER — Encounter: Payer: Self-pay | Admitting: Gastroenterology

## 2019-09-24 ENCOUNTER — Ambulatory Visit: Payer: Self-pay | Admitting: Gastroenterology

## 2019-10-06 ENCOUNTER — Ambulatory Visit: Payer: Self-pay | Admitting: Gastroenterology

## 2019-11-23 ENCOUNTER — Ambulatory Visit: Payer: Self-pay | Admitting: Gastroenterology

## 2019-12-26 ENCOUNTER — Encounter: Payer: Self-pay | Admitting: Family Medicine

## 2019-12-26 ENCOUNTER — Ambulatory Visit: Payer: Self-pay | Admitting: Family Medicine

## 2019-12-26 ENCOUNTER — Other Ambulatory Visit: Payer: Self-pay

## 2019-12-26 DIAGNOSIS — Z0000 Encounter for general adult medical examination without abnormal findings: Secondary | ICD-10-CM

## 2019-12-26 NOTE — Progress Notes (Signed)
S. Here to physical exam and blood work, has no complain  Not having any med problem and no ttaking any medicine  A He sounds good No SOB  Talking normal  respiratory effort normal  A/p Screening for anemia, hyperlipdemia Plan Cbc, lipid and CMP ordered

## 2020-01-02 ENCOUNTER — Telehealth: Payer: Self-pay | Admitting: Family Medicine

## 2020-01-18 ENCOUNTER — Encounter: Payer: Self-pay | Admitting: *Deleted

## 2020-01-18 ENCOUNTER — Ambulatory Visit: Payer: Self-pay | Admitting: Gastroenterology

## 2020-01-18 ENCOUNTER — Other Ambulatory Visit: Payer: Self-pay

## 2020-01-21 ENCOUNTER — Ambulatory Visit: Payer: Self-pay | Admitting: Gastroenterology

## 2020-05-12 ENCOUNTER — Ambulatory Visit: Payer: Self-pay | Admitting: Gastroenterology

## 2020-05-12 ENCOUNTER — Encounter: Payer: Self-pay | Admitting: *Deleted

## 2021-06-05 ENCOUNTER — Encounter: Payer: Self-pay | Admitting: General Surgery

## 2022-06-13 ENCOUNTER — Encounter (HOSPITAL_COMMUNITY): Payer: Self-pay | Admitting: Emergency Medicine

## 2022-06-13 ENCOUNTER — Ambulatory Visit (HOSPITAL_COMMUNITY)
Admission: EM | Admit: 2022-06-13 | Discharge: 2022-06-13 | Disposition: A | Discharge status: Home / Self Care | Payer: Self-pay | Attending: Internal Medicine | Admitting: Internal Medicine

## 2022-06-13 DIAGNOSIS — G8918 Other acute postprocedural pain: Secondary | ICD-10-CM

## 2022-06-13 DIAGNOSIS — M79642 Pain in left hand: Secondary | ICD-10-CM

## 2022-06-13 MED ORDER — ACETAMINOPHEN 325 MG PO TABS: 650.0000 mg | ORAL_TABLET | Freq: Four times a day (QID) | ORAL | 0 refills | Status: DC | PRN

## 2022-06-13 MED ORDER — HYDROCODONE-ACETAMINOPHEN 5-325 MG PO TABS: 1.0000 | ORAL_TABLET | Freq: Four times a day (QID) | ORAL | 0 refills | Status: AC | PRN

## 2022-06-13 NOTE — ED Provider Notes (Signed)
[facility]    CSN: [id number] Arrival date & time: 06/13/22  1448      History   Chief Complaint Chief Complaint  Patient presents with   Hand Pain    HPI Adam Jacobs is a 61 y.o. male.   Patient presents urgent care for evaluation of postoperative left hand pain and numbness and tingling to the first 3 digits of the left hand that has been present ever since he had surgery a couple weeks ago on May 21, 2022.  The numbness and tingling to the left hand is not new and has been present ever since he woke up from surgery.  The surgery was performed in [place] as he experienced an accident while he was there traveling.  When he returned to [place], he went to [facility] orth[facility]ics a few days ago where he was evaluated and sutures were removed.  Steri-Strips were placed over the surgical incisions to the left wrist and x-rays were performed of the left wrist.  Patient was told that the x-rays appeared normal, but he was unable to be formally seen by Raliegh Ip due to insurance problems.  He presents to urgent care today for further evaluation of pain and numbness to the left wrist.  Patient was prescribed narcotic pain medicine after the surgical procedure but states that he ran out of this medication 1 week ago and continues to have severe pain to the left wrist.  Denies reinjury of the left wrist, new neurologic symptoms to the left hand/wrist, fever/chills, and swelling/redness to the left wrist.  He has continued to wear the left wrist brace provided by surgeon in [place] and has been taking Tylenol p.m. for pain without much relief.  Pain to the left wrist is currently an 8 on a scale of 0-10.     History reviewed. No pertinent past medical history.  Patient Active Problem List   Diagnosis Date Noted   Stomach ache 07/03/2019   Neck problem 07/03/2019   3 falls in previous year 07/03/2019   Constipation 07/03/2019   Hemorrhoids 07/03/2019   Swelling  of throat 07/28/2016   Abscess of head 07/28/2016    Past Surgical History:  Procedure Laterality Date   WRIST SURGERY         Home Medications    Prior to Admission medications   Medication Sig Start Date End Date Taking? Authorizing Provider  acetaminophen (TYLENOL) 325 MG tablet Take 2 tablets (650 mg total) by mouth every 6 (six) hours as needed. 06/13/22  Yes Talbot Grumbling, FNP  HYDROcodone-acetaminophen (NORCO/VICODIN) 5-325 MG tablet Take 1 tablet by mouth every 6 (six) hours as needed for up to 3 days. 06/13/22 06/16/22 Yes StanhopeStasia Cavalier, FNP    Family History No family history on file.  Social History Social History   Tobacco Use   Smoking status: Former    Packs/day: 1.00    Types: Cigarettes    Quit date: 2019    Years since quitting: 4.7   Smokeless tobacco: Never  Vaping Use   Vaping Use: Never used  Substance Use Topics   Alcohol use: No   Drug use: No     Allergies   Patient has no known allergies.   Review of Systems Review of Systems Per HPI  Physical Exam Triage Vital Signs ED Triage Vitals  Enc Vitals Group     BP 06/13/22 1552 128/83     Pulse Rate 06/13/22 1552 76  Resp 06/13/22 1552 16     Temp 06/13/22 1552 97.6 F (36.4 C)     Temp src --      SpO2 06/13/22 1552 100 %     Weight --      Height --      Head Circumference --      Peak Flow --      Pain Score 06/13/22 1549 8     Pain Loc --      Pain Edu? --      Excl. in [place]? --    No data found.  Updated Vital Signs BP 128/83 (BP Location: Right Arm)   Pulse 76   Temp 97.6 F (36.4 C)   Resp 16   SpO2 100%   Visual Acuity Right Eye Distance:   Left Eye Distance:   Bilateral Distance:    Right Eye Near:   Left Eye Near:    Bilateral Near:     Physical Exam Vitals and nursing note reviewed.  Constitutional:      Appearance: He is not ill-appearing or toxic-appearing.  HENT:     Head: Normocephalic and atraumatic.     Right Ear: Hearing  and external ear normal.     Left Ear: Hearing and external ear normal.     Nose: Nose normal.     Mouth/Throat:     Lips: Pink.  Eyes:     General: Lids are normal. Vision grossly intact. Gaze aligned appropriately.     Extraocular Movements: Extraocular movements intact.     Conjunctiva/sclera: Conjunctivae normal.  Pulmonary:     Effort: Pulmonary effort is normal.  Musculoskeletal:     Right wrist: Normal.     Left wrist: Tenderness present. No swelling, deformity, effusion or lacerations. Decreased range of motion. Normal pulse.     Right hand: Normal.     Left hand: No swelling. Decreased range of motion. Decreased strength. Normal capillary refill. Normal pulse.     Cervical back: Neck supple.     Comments: Left wrist/hand: Decreased range of motion at the left wrist and with flexion of all 5 digits on the left hand due to pain.  There is postoperative tenderness to the left wrist with palpation and with passive range of motion.  Patient is able to make a loose fist with the left hand.  There is decreased sensation to the first second and third digits of the left hand, although patient is able to feel examiner palpating distal fingertips of 3 fingers with decreased sensation.  Postoperative wounds appear to be healing appropriately without signs of infection as seen below in images.   Skin:    General: Skin is warm and dry.     Capillary Refill: Capillary refill takes less than 2 seconds.     Findings: No rash.  Neurological:     General: No focal deficit present.     Mental Status: He is alert and oriented to person, place, and time. Mental status is at baseline.     Cranial Nerves: No dysarthria or facial asymmetry.  Psychiatric:        Mood and Affect: Mood normal.        Speech: Speech normal.        Behavior: Behavior normal.        Thought Content: Thought content normal.        Judgment: Judgment normal.          UC Treatments / Results  Labs (all labs  ordered  are listed, but only abnormal results are displayed) Labs Reviewed - No data to display  EKG   Radiology No results found.  Procedures Procedures (including critical care time)  Medications Ordered in UC Medications - No data to display  Initial Impression / Assessment and Plan / UC Course  I have reviewed the triage vital signs and the nursing notes.  Pertinent labs & imaging results that were available during my care of the patient were reviewed by me and considered in my medical decision making (see chart for details).   1.  Postoperative pain of the left hand There are no new/acute findings to patient's neurologic exam of the left hand.  Wound appears to be healing appropriately without infection.  Recommend continuing use of the wrist brace and ice to the left wrist/elevation to reduce pain and inflammation to the left hand.  Patient may take Tylenol 650 mg every 6 hours as needed for pain.  If pain is not well controlled with Tylenol, he may take hydrocodone/acetaminophen 1 tablet every 6 hours as needed for severe pain.  Provided 12 tablets (3 days worth) of Norco Vicodin for postoperative acute pain.  PDMP in [place] reviewed and [place] PDMP reviewed manually prior to prescribing narcotic pain medication.   Encouraged patient to follow-up with [facility] tomorrow via appointment or walk-in visit at their urgent care practice for further evaluation and ongoing management of post operative pain and decreased sensation to the left hand. Patient expresses agreement with this plan.   Discussed physical exam and available lab work findings in clinic with patient.  Counseled patient regarding appropriate use of medications and potential side effects for all medications recommended or prescribed today. Discussed red flag signs and symptoms of worsening condition,when to call the PCP office, return to urgent care, and when to seek higher level of care in the emergency department.  Patient verbalizes understanding and agreement with plan. All questions answered. Patient discharged in stable condition.   Final Clinical Impressions(s) / UC Diagnoses   Final diagnoses:  Postoperative pain  Pain of left hand     Discharge Instructions      I would like for you to call [facility] tomorrow to schedule a visit for soon as possible for follow-up on your postoperative hand pain.  Continue wearing your brace.  Apply ice to reduce inflammation to your wrist/hand.   You may take Tylenol 650 mg every 6 hours as needed for pain. I have sent in hydrocodone-acetaminophen for you to the pharmacy to take for breakthrough severe pain.  Once pain is controlled with strong pain medicine (hydrocodone-acetaminophen), switch back to using plain Tylenol for pain relief.  If you develop any new or worsening symptoms or do not improve in the next 2 to 3 days, please return.  If your symptoms are severe, please go to the emergency room.  Follow-up with your primary care provider for further evaluation and management of your symptoms as well as ongoing wellness visits.  I hope you feel better!     ED Prescriptions     Medication Sig Dispense Auth. Provider   HYDROcodone-acetaminophen (NORCO/VICODIN) 5-325 MG tablet Take 1 tablet by mouth every 6 (six) hours as needed for up to 3 days. 12 tablet Joella Prince M, FNP   acetaminophen (TYLENOL) 325 MG tablet Take 2 tablets (650 mg total) by mouth every 6 (six) hours as needed. 30 tablet Talbot Grumbling, FNP      I have reviewed the PDMP  during this encounter.   Talbot Grumbling, [place] 06/16/22 1134

## 2022-06-13 NOTE — ED Triage Notes (Signed)
Pt reports hurt hand back in September (26th) and had surgery on hand. Pt wearing a brace on left hand/wrist. Reports left 1st, 2nd and 3rd fingers are numb and still having pain. Pt adds that he is out of pain medications. Pt taking Tylenol PM.

## 2022-06-13 NOTE — Discharge Instructions (Addendum)
I would like for you to call [facility] tomorrow to schedule a visit for soon as possible for follow-up on your postoperative hand pain.  Continue wearing your brace.  Apply ice to reduce inflammation to your wrist/hand.   You may take Tylenol 650 mg every 6 hours as needed for pain. I have sent in hydrocodone-acetaminophen for you to the pharmacy to take for breakthrough severe pain.  Once pain is controlled with strong pain medicine (hydrocodone-acetaminophen), switch back to using plain Tylenol for pain relief.  If you develop any new or worsening symptoms or do not improve in the next 2 to 3 days, please return.  If your symptoms are severe, please go to the emergency room.  Follow-up with your primary care provider for further evaluation and management of your symptoms as well as ongoing wellness visits.  I hope you feel better!

## 2022-07-04 ENCOUNTER — Other Ambulatory Visit: Payer: Self-pay | Admitting: Family Medicine

## 2022-07-04 DIAGNOSIS — Z122 Encounter for screening for malignant neoplasm of respiratory organs: Secondary | ICD-10-CM

## 2022-08-07 ENCOUNTER — Ambulatory Visit (INDEPENDENT_AMBULATORY_CARE_PROVIDER_SITE_OTHER): Payer: Commercial Managed Care - HMO

## 2022-08-07 ENCOUNTER — Ambulatory Visit: Payer: Commercial Managed Care - HMO | Admitting: Orthopaedic Surgery

## 2022-08-07 ENCOUNTER — Encounter: Payer: Self-pay | Admitting: Orthopaedic Surgery

## 2022-08-07 DIAGNOSIS — M25532 Pain in left wrist: Secondary | ICD-10-CM

## 2022-08-07 DIAGNOSIS — S5292XS Unspecified fracture of left forearm, sequela: Secondary | ICD-10-CM | POA: Diagnosis not present

## 2022-08-07 DIAGNOSIS — M79642 Pain in left hand: Secondary | ICD-10-CM

## 2022-08-07 DIAGNOSIS — S52202S Unspecified fracture of shaft of left ulna, sequela: Secondary | ICD-10-CM | POA: Diagnosis not present

## 2022-08-07 MED ORDER — GABAPENTIN 300 MG PO CAPS: 300.0000 mg | ORAL_CAPSULE | Freq: Three times a day (TID) | ORAL | 3 refills | Status: DC

## 2022-08-07 NOTE — Progress Notes (Signed)
Office Visit Note   Patient: Adam Jacobs           Date of Birth: 17-Feb-1961           MRN: [id number] Visit Date: 08/07/2022              Requested by: No referring provider defined for this encounter. PCP: Patient, No Pcp Per   Assessment & Plan: Visit Diagnoses:  1. Pain in left wrist   2. Pain in left hand   3. Forearm fractures, both bones, closed, left, sequela     Plan: Mr.Morrish sustained an on-the-job injury to his left forearm on 21 May 2022.  Apparently he was on the job in [place] when a truck lift gate fell on his left forearm.  Some notes from [place] are available on the computer.  He sustained a grade 1-2 open fracture of the left radius and ulnar shafts.  On the day of the injury he was taken to the operating room for irrigation and debridement and returned the following day for further debridement and formal open reduction internal fixation.  Postoperative notes indicate that he had normal sensation to his left hand and was able to move all of his fingers.  The patient relates that he has had some numbness and tingling into the radial 3 digits ever since he had surgery.  He was seen at [facility] in October for stitch removal.  There was no follow-up apparently because of insurance issues.  He was seen in the urgent care in mid October for pain at which point it was documented he had numbness and tingling and some swelling of his fingers.  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  He has positive Tinel's over the volar incision at the mid forearm with tingling referred to the radial 3 digits.  There is no evidence of infection the wounds of healed.  His fingers are quite stiff and he certainly could have an RSD.  He has intrinsic atrophy and difficulty with opposition of thumb the little finger consistent with his median nerve injury.  I am going to start gabapentin and try physical therapy as he has considerable  stiffness of his fingers.  Like to see him back in 2 weeks.  Follow-Up Instructions: Return in about 2 weeks (around 08/21/2022).   Orders:  Orders Placed This Encounter  Procedures   XR Hand Complete Left   XR Wrist Complete Left   Ambulatory referral to Occupational Therapy   Meds ordered this encounter  Medications   gabapentin (NEURONTIN) 300 MG capsule    Sig: Take 1 capsule (300 mg total) by mouth 3 (three) times daily. 3 times a day when necessary neuropathy pain    Dispense:  90 capsule    Refill:  3      Procedures: No procedures performed   Clinical Data: No additional findings.   Subjective: Chief Complaint  Patient presents with   Left Hand - Pain  61 year old gentleman sustained an injury to his left forearm approximately 2-1/2 months ago while working in [place].  Notes from [facility] indicated that he had either a grade 1 or 2 open fracture of the left radius and ulnar shaft.  He was taken to the operating room on the day of the injury for irrigation and debridement and the following day for formal open reduction internal fixation.  He was discharged on postop day #1 to  return home to [place].  Notes at that point indicate that he had normal sensation to his hand.  Patient relates that he has had some numbness and tingling in his hands since he has had surgery.  He has been to [facility] on 1 occasion for stitch removal but apparently because of insurance problems was not seen in follow-up.  He was seen at the urgent care in mid October i.e. 2 months ago for related pain.  He was on hydrocodone.  He apparently has not had any formal follow-up in the last 2 months.  His biggest issues are stiffness of his hands associated with numbness and tingling in the radial 3 digits.  Denies any fever or chills.  Does relate that he had a somewhat similar injury about 14 years ago that required surgery for 1 or both of the forearm bones and subsequent removal of  the plate.  Did not have any problem after that injury  HPI  Review of Systems   Objective: Vital Signs: There were no vitals taken for this visit.  Physical Exam Constitutional:      Appearance: He is well-developed.  Pulmonary:     Effort: Pulmonary effort is normal.  Skin:    General: Skin is warm and dry.  Neurological:     Mental Status: He is alert and oriented to person, place, and time.  Psychiatric:        Behavior: Behavior normal.     Ortho Exam left forearm has both a mid volar incision and a second incision over the ulnar in the mid forearm.  There is no swelling.  There is atrophy of the forearm musculature compared to the right side.  There is a definite Tinel directly over the midportion of the volar incision with referred pain along the distal portion of the forearm extending into the thumb index and long fingers.  He has limited sensation to those 3 digits and significant stiffness.  He only has about 15 degrees of flexion of his hand with some loss of the skin and falls and swelling of the digits.  There is atrophy of the intrinsic musculature and weakness of opposition of the thumb to the little finger with thenar atrophy.  Capillary refill.  Does have some hypersensitivity of the fingers but the temperature appears to be the same in the left hand as it does on the right.  He also has some limited flexion of the wrist in pronation and supination.  The wounds are closed.  There is no evidence of infection.  Specialty Comments:  No specialty comments available.  Imaging: XR Hand Complete Left  Result Date: 08/07/2022 Films of the left hand were obtained in several projections.  There is no obvious fracture or dislocation  XR Wrist Complete Left  Result Date: 08/07/2022 Films of the left forearm were obtained in several projections.  There is been open reduction internal fixation of both the radius and ulnar shafts with plate fixation in good position.  Fracture  occurred about 2-1/2 months ago and there appears to be callus.  No complications    PMFS History: Patient Active Problem List   Diagnosis Date Noted   Forearm fractures, both bones, closed, left, sequela 08/07/2022   Stomach ache 07/03/2019   Neck problem 07/03/2019   3 falls in previous year 07/03/2019   Constipation 07/03/2019   Hemorrhoids 07/03/2019   Swelling of throat 07/28/2016   Abscess of head 07/28/2016   History reviewed. No pertinent past  medical history.  History reviewed. No pertinent family history.  Past Surgical History:  Procedure Laterality Date   WRIST SURGERY     Social History   Occupational History   Not on file  Tobacco Use   Smoking status: Former    Packs/day: 1.00    Types: Cigarettes    Quit date: 2019    Years since quitting: 4.9   Smokeless tobacco: Never  Vaping Use   Vaping Use: Never used  Substance and Sexual Activity   Alcohol use: No   Drug use: No   Sexual activity: Not on file     Valeria Batman, MD   Note - This record has been created using [employer].  Chart creation errors have been sought, but may not always  have been located. Such creation errors do not reflect on  the standard of medical care.

## 2022-08-16 ENCOUNTER — Encounter: Payer: Commercial Managed Care - HMO | Admitting: Rehabilitative and Restorative Service Providers"

## 2022-08-22 ENCOUNTER — Ambulatory Visit: Payer: Commercial Managed Care - HMO | Admitting: Orthopaedic Surgery

## 2022-08-22 ENCOUNTER — Encounter: Payer: Self-pay | Admitting: Orthopaedic Surgery

## 2022-08-22 DIAGNOSIS — S52202S Unspecified fracture of shaft of left ulna, sequela: Secondary | ICD-10-CM | POA: Diagnosis not present

## 2022-08-22 DIAGNOSIS — S5292XS Unspecified fracture of left forearm, sequela: Secondary | ICD-10-CM

## 2022-08-22 NOTE — Progress Notes (Signed)
Office Visit Note   Patient: Adam Jacobs           Date of Birth: Dec 18, 1960           MRN: [id number] Visit Date: 08/22/2022              Requested by: No referring provider defined for this encounter. PCP: Patient, No Pcp Per   Assessment & Plan: Visit Diagnoses:  1. Forearm fractures, both bones, closed, left, sequela     Plan: Mr.Knoll was seen several weeks ago for evaluation of left upper extremity injury as previously outlined.  He sustained a Workmen's Comp. injury to his forearm while [place] involving an open both bones forearm fracture.  This was treated in [place] with irrigation debridement and open reduction internal fixation.  He notes that since the injury or surgery he has had some altered sensibility in the radial 3 digits.  Injury occurred in September.  I saw him for the first time last week and placed him on gabapentin 300 mg 3 times a day and initiated a course of physical therapy.  His first therapy session is tomorrow.  He thinks the gabapentin may be helping a little bit.  He has a definite Tinel's along the median nerve beyond the area of the open fracture and surgery which may indicate some regeneration of the median nerve.  Motor exam appears to be intact with good opposition of thumb to little finger.  His fingers are quite stiff and think therapy will help with that.  He will continue with the gabapentin and follow-up in 3 weeks with Dr.Xu.  No work  Follow-Up Instructions: Return in about 3 weeks (around 09/12/2022), or Dr Roda Shutters.   Orders:  No orders of the defined types were placed in this encounter.  No orders of the defined types were placed in this encounter.     Procedures: No procedures performed   Clinical Data: No additional findings.   Subjective: Chief Complaint  Patient presents with   Left Forearm - Follow-up  Patient comes in for a two week follow up on his left forearm. He said that it still hurts, and worse if water or  cold air touches it. He is taking Gabapentin and not sure that it helps.  HPI  Review of Systems   Objective: Vital Signs: There were no vitals taken for this visit.  Physical Exam Constitutional:      Appearance: He is well-developed.  Eyes:     Pupils: Pupils are equal, round, and reactive to light.  Pulmonary:     Effort: Pulmonary effort is normal.  Skin:    General: Skin is warm and dry.  Neurological:     Mental Status: He is alert and oriented to person, place, and time.  Psychiatric:        Behavior: Behavior normal.     Ortho Exam awake alert and oriented x 3.  Comfortable sitting.  No obvious distress.  I thought he had a little bit less swelling of his left hand and fingers.  He had good opposition of thumb the little finger but lacked probably 3 fingerbreadths of touching the tips of his fingers to the palm of his hand.  This continues to have altered sensibility in the median nerve distribution of his hand but also appears to have an advancing Tinel's on the volar aspect of the forearm at least 3 inches distal to his incisional area.  Seems that he has had a  partial injury to the median nerve as the motor portion it appears to be intact.  No evidence of infection.  Forearm is supple without evidence of swelling  Specialty Comments:  No specialty comments available.  Imaging: No results found.   PMFS History: Patient Active Problem List   Diagnosis Date Noted   Forearm fractures, both bones, closed, left, sequela 08/07/2022   Stomach ache 07/03/2019   Neck problem 07/03/2019   3 falls in previous year 07/03/2019   Constipation 07/03/2019   Hemorrhoids 07/03/2019   Swelling of throat 07/28/2016   Abscess of head 07/28/2016   History reviewed. No pertinent past medical history.  History reviewed. No pertinent family history.  Past Surgical History:  Procedure Laterality Date   WRIST SURGERY     Social History   Occupational History   Not on file   Tobacco Use   Smoking status: Former    Packs/day: 1.00    Types: Cigarettes    Quit date: 2019    Years since quitting: 4.9   Smokeless tobacco: Never  Vaping Use   Vaping Use: Never used  Substance and Sexual Activity   Alcohol use: No   Drug use: No   Sexual activity: Not on file

## 2022-08-23 ENCOUNTER — Encounter: Payer: Self-pay | Admitting: Rehabilitative and Restorative Service Providers"

## 2022-08-23 ENCOUNTER — Ambulatory Visit (INDEPENDENT_AMBULATORY_CARE_PROVIDER_SITE_OTHER): Payer: Commercial Managed Care - HMO | Admitting: Rehabilitative and Restorative Service Providers"

## 2022-08-23 ENCOUNTER — Other Ambulatory Visit: Payer: Self-pay

## 2022-08-23 ENCOUNTER — Encounter: Payer: Commercial Managed Care - HMO | Admitting: Rehabilitative and Restorative Service Providers"

## 2022-08-23 DIAGNOSIS — R600 Localized edema: Secondary | ICD-10-CM

## 2022-08-23 DIAGNOSIS — M79642 Pain in left hand: Secondary | ICD-10-CM | POA: Diagnosis not present

## 2022-08-23 DIAGNOSIS — R202 Paresthesia of skin: Secondary | ICD-10-CM

## 2022-08-23 DIAGNOSIS — M25642 Stiffness of left hand, not elsewhere classified: Secondary | ICD-10-CM

## 2022-08-23 DIAGNOSIS — M6281 Muscle weakness (generalized): Secondary | ICD-10-CM

## 2022-08-23 DIAGNOSIS — R278 Other lack of coordination: Secondary | ICD-10-CM

## 2022-08-23 DIAGNOSIS — M25632 Stiffness of left wrist, not elsewhere classified: Secondary | ICD-10-CM | POA: Diagnosis not present

## 2022-08-23 NOTE — Therapy (Signed)
OUTPATIENT OCCUPATIONAL THERAPY ORTHO EVALUATION  Patient Name: Adam Jacobs MRN: [id number] DOB:[birth year], 61 y.o., male Today's Date: 08/23/2022  PCP: N/A REFERRING PROVIDER: Valeria Batman, MD   END OF SESSION:  OT End of Session - 08/23/22 1352     Visit Number 1    Number of Visits 10    Date for OT Re-Evaluation 10/05/22    Authorization Type [employer]    OT Start Time 1352    OT Stop Time 1431    OT Time Calculation (min) 39 min    Activity Tolerance No increased pain;Patient limited by pain;Patient limited by fatigue;Patient tolerated treatment well    Behavior During Therapy Anxious;WFL for tasks assessed/performed             History reviewed. No pertinent past medical history. Past Surgical History:  Procedure Laterality Date   WRIST SURGERY     Patient Active Problem List   Diagnosis Date Noted   Forearm fractures, both bones, closed, left, sequela 08/07/2022   Stomach ache 07/03/2019   Neck problem 07/03/2019   3 falls in previous year 07/03/2019   Constipation 07/03/2019   Hemorrhoids 07/03/2019   Swelling of throat 07/28/2016   Abscess of head 07/28/2016    ONSET DATE: DOI 05/21/22; DOS 05/22/22  REFERRING DIAG:  N02.725 (ICD-10-CM) - Pain in left hand  M25.532 (ICD-10-CM) - Pain in left wrist    THERAPY DIAG:  Pain in left hand - Plan: Ot plan of care cert/re-cert  Localized edema - Plan: Ot plan of care cert/re-cert  Muscle weakness (generalized) - Plan: Ot plan of care cert/re-cert  Stiffness of left wrist, not elsewhere classified - Plan: Ot plan of care cert/re-cert  Other lack of coordination - Plan: Ot plan of care cert/re-cert  Stiffness of left hand, not elsewhere classified - Plan: Ot plan of care cert/re-cert  Paresthesia of skin - Plan: Ot plan of care cert/re-cert  Rationale for Evaluation and Treatment: Rehabilitation  SUBJECTIVE:   SUBJECTIVE STATEMENT: He states touching water, cold air and night time are very  painful to his arm, especially in Lt and median nerve distribution.  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  He states that he has very bad pain with taking a shower when the water touches his skin and has other home basic ADL problems and IADL problems as well.   PERTINENT HISTORY: 13 weeks post- op now.  This pt originally had a work injury, fracturing Lt radius and ulna on 05/21/22 which lead to irrigation and debridement, removal of old volar plate from old ulna shaft fx, and with definitive fixation of the both bone forearm fractures.  He has since gone to the ED and followed up with Dr. Cleophas Dunker with c/o swelling in Lt hand/fingers, pain, stiffness, numbness in median N distribution, and possible RSD / CRPS symptoms.    PRECAUTIONS: None  WEIGHT BEARING RESTRICTIONS: No  PAIN:  Are you having pain? None at rest Rating: 0/10 at rest now, up to 8/10 at worst in past week   FALLS: Has patient fallen in last 6 months? No  LIVING ENVIRONMENT: Lives with: lives alone   PLOF: Independent  PATIENT GOALS: To have less pain in Lt arm and hand, return to truck driving    OBJECTIVE: (All objective assessments below are from initial evaluation on: 08/23/22 unless otherwise specified.)   HAND DOMINANCE: Right   ADLs: Overall ADLs: States decreased ability to  grab, hold household objects, pain and inability to open containers, perform FMS tasks (manipulate fasteners on clothing), mild to moderate bathing problems as well.    FUNCTIONAL OUTCOME MEASURES: Eval: Quck DASH TBD next session when able    (Higher % Score  =  More Impairment)      UPPER EXTREMITY ROM     Shoulder to Wrist AROM Left eval  Forearm supination 78  Forearm pronation  71  Wrist flexion 59  Wrist extension 34  (Blank rows = not tested)  Eval: Digits 1 through 3 are very stiff index finger being the worst, so OT measures this specifically today as an  example of the worst of the motion.  Digits 4 and 5 have sensation and are moving much better, are not hypersensitive.  Thumb is somewhat stiff Hand AROM Left eval  Full Fist Ability (or Gap to Distal Palmar Crease) No- 8.7cm Gap to IF to [facility]  Thumb Opposition  (Kapandji Scale)  1 / 10  Thumb MCP (0-60)   Thumb IP (0-80)   Thumb Radial Abduction Span    Thumb Palmar Abduction Span    Index MCP (0-90) 0-  66  Index PIP (0-100) 0-  28  Index DIP (0-70)  0 - 0  (Blank rows = not tested)   UPPER EXTREMITY MMT:    Eval: Due to hyper-sensation/pain/allodynia OT did not push or pull to test muscle strength today.  Will be determined when appropriate MMT Left TBD  Elbow flexion   Elbow extension   Forearm supination   Forearm pronation   Wrist flexion   Wrist extension   Wrist ulnar deviation   Wrist radial deviation   (Blank rows = not tested)  HAND FUNCTION: Eval: Observed weakness in affected left hand.  Details to be determined Grip strength Right: TBD lbs, Left: TBD lbs   COORDINATION: Eval: Observed coordination impairments with affected left hand.  He could not make a fist or oppose to his index finger correctly, and details will be determined in future sessions Box and Blocks Test: TBD Blocks today (TBD is [facility]); 9 Hole Peg Test Right: TBD sec, Left: TBD sec (TBD sec is WFL)   SENSATION: Eval: Light touch absent in Lt IF, thumb, MF tips- unable to perceive Semmes-Weinstein monofilament 6.65- he was strongly urged to use caution with daily activities to prevent injury   EDEMA:   Eval:  Mildly swollen in radial side of hand today, could be CRPS type response or simply from disuse from increased pain and allodynia  COGNITION: Eval: Overall cognitive status: WFL for evaluation today, though he was somewhat anxious and fearful due to pain symptoms  OBSERVATIONS:   Eval: He has overt signs of hypersensitivity or allodynia in the left wrist and hand through median nerve  distribution only.  He also lacks full sensation of light touch in the tips of digits 1 through 3, even sensation of pressure is somewhat diminished. He is quite stiff and swollen through the left wrist and hand as well.    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   TODAY'S TREATMENT:  Post-evaluation treatment: OT first educates on the importance of touching his skin lightly, especially all around the nonsensitive or painful areas.  He had overt fever and flinching when OT attempts to touch his "painful" areas of his hand.  Eventually he tolerates a vibration  for 2 to 3 minutes and OT shows him how to do progressively without adding to pain.  He states feeling better afterwards.  He is given the following exercise and nerve re-education as below and states understanding.  "Every 2-3 hours, I want you to touch your left arm, all around the areas where it hurts.  Start where it is easy and get close to where it hurts.  "Tease it."  Spend 2 minutes doing that.  You can use a towel or vibration too.    Try buying gloves or mittens that are loose to keep your hand in.   Also do nerve glides 3-4 x day  (look at the picture)."  Exercises - Median Nerve Flossing  - 4-6 x daily - 1 sets - 10-15 reps   PATIENT EDUCATION: Education details: See tx section above for details  Person educated: Patient Education method: Verbal Instruction, Teach back, Handouts  Education comprehension: States and demonstrates understanding, Additional Education required    HOME EXERCISE PROGRAM: Access Code: [email or web address] URL: [email or web address] Date: 08/23/2022 Prepared by: Fannie Knee   GOALS: Goals reviewed with patient? Yes   SHORT TERM GOALS: (STG required if POC>30 days) Target Date: 09/07/22  Pt will obtain protective, custom orthotic. Goal status: TBD/PRN  2.   Pt will demo/state understanding of initial HEP to improve pain levels and prerequisite motion. Goal status: INITIAL   LONG TERM GOALS: Target Date: 10/05/22  Pt will improve functional ability by decreased impairment per Quick DASH assessment from TBD to TBD or better, for better quality of life. Goal status: TBD next session when more time available  2.  Pt will improve grip strength in Lt hand from unable to at least 40lbs for functional use at home and in IADLs. Goal status: INITIAL  3.  Pt will improve A/ROM in Lt wrist flex/ext from 59*/34* to at least 65* both ways, to have functional motion for tasks like reach and grasp.  Goal status: INITIAL  4.  Pt will improve strength in Lt Wrist flex/ext from guarded and not using to at least 4/5 MMT to have increased functional ability to carry out selfcare and higher-level homecare tasks with no difficulty. Goal status: INITIAL  5.  Pt will improve coordination skills in Lt arm, as seen by [facility] score on box and blocks testing to have increased functional ability to carry out fine motor tasks (fasteners, etc.) and more complex, coordinated IADLs (meal prep, sports, etc.).  Goal status: INITIAL  6.  Pt will decrease pain at worst from 8/10 to 2/10 or better to have better sleep and occupational participation in daily roles. Goal status: INITIAL   ASSESSMENT:  CLINICAL IMPRESSION: Patient is a 61 y.o. male who was seen today for occupational therapy evaluation for hypersensitive, painful, numb, stiff left hand and wrist following both bone forearm fracture and surgical intervention.  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  These things are significantly decreasing his functional ability and he will benefit from occupational therapy to improve that.   PERFORMANCE DEFICITS: in functional skills including ADLs, IADLs, coordination,  dexterity, proprioception, sensation, edema, tone, ROM, strength, pain, fascial restrictions, flexibility, Fine motor control, Gross motor control, body mechanics, endurance, decreased knowledge of precautions, and UE functional use, cognitive skills including emotional, perception, problem solving, and safety awareness, and psychosocial skills including coping strategies, environmental adaptation, and habits.   IMPAIRMENTS:  are limiting patient from ADLs, IADLs, rest and sleep, work, and leisure.   COMORBIDITIES: may have co-morbidities  that affects occupational performance. Patient will benefit from skilled OT to address above impairments and improve overall function.  MODIFICATION OR ASSISTANCE TO COMPLETE EVALUATION: Min-Moderate modification of tasks or assist with assess necessary to complete an evaluation.  OT OCCUPATIONAL PROFILE AND HISTORY: Detailed assessment: Review of records and additional review of physical, cognitive, psychosocial history related to current functional performance.  CLINICAL DECISION MAKING: Moderate - several treatment options, min-mod task modification necessary  REHAB POTENTIAL: Excellent  EVALUATION COMPLEXITY: Moderate      PLAN:  OT FREQUENCY: 1-2x/week  OT DURATION: 6 weeks (through 10/05/22 as needed)   PLANNED INTERVENTIONS: self care/ADL training, therapeutic exercise, therapeutic activity, neuromuscular re-education, manual therapy, scar mobilization, passive range of motion, splinting, electrical stimulation, ultrasound, fluidotherapy, compression bandaging, moist heat, cryotherapy, contrast bath, patient/family education, and coping strategies training  RECOMMENDED OTHER SERVICES: none now   CONSULTED AND AGREED WITH PLAN OF CARE: Patient  PLAN FOR NEXT SESSION: Check initial recommendations and HEP, upgrade to stretches at the forearm and wrist and hand as tolerated.  Also do functional activities for forced use of left hand   Fannie Kneeathanael  Oleta Gunnoe, OTR/L, CHT 08/23/2022, 4:40 PM

## 2022-08-24 NOTE — Therapy (Incomplete)
OUTPATIENT OCCUPATIONAL THERAPY TREATMENT NOTE  Patient Name: Adam Jacobs MRN: [id number] DOB:Oct 06, 1960, 61 y.o., male Today's Date: 08/24/2022  PCP: N/A REFERRING PROVIDER: Valeria Batman, MD   END OF SESSION:    No past medical history on file. Past Surgical History:  Procedure Laterality Date   WRIST SURGERY     Patient Active Problem List   Diagnosis Date Noted   Forearm fractures, both bones, closed, left, sequela 08/07/2022   Stomach ache 07/03/2019   Neck problem 07/03/2019   3 falls in previous year 07/03/2019   Constipation 07/03/2019   Hemorrhoids 07/03/2019   Swelling of throat 07/28/2016   Abscess of head 07/28/2016    ONSET DATE: DOI 05/21/22; DOS 05/22/22  REFERRING DIAG:  T51.761 (ICD-10-CM) - Pain in left hand  M25.532 (ICD-10-CM) - Pain in left wrist    THERAPY DIAG:  No diagnosis found.  Rationale for Evaluation and Treatment: Rehabilitation  PERTINENT HISTORY: 14 weeks post- op now.  This pt originally had a work injury, fracturing Lt radius and ulna on 05/21/22 which lead to irrigation and debridement, removal of old volar plate from old ulna shaft fx, and with definitive fixation of the both bone forearm fractures.  He has since gone to the ED and followed up with Dr. Cleophas Dunker with c/o swelling in Lt hand/fingers, pain, stiffness, numbness in median N distribution, and possible RSD / CRPS symptoms.   PRECAUTIONS: None; WEIGHT BEARING RESTRICTIONS: No   SUBJECTIVE:   SUBJECTIVE STATEMENT: He states ***    PAIN:  Are you having pain? 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    FUNCTIONAL OUTCOME MEASURES: 08/28/22:  Nadean Corwin DASH *** impairment today    (Higher % Score  =  More Impairment)      UPPER EXTREMITY ROM     Shoulder to Wrist AROM Left eval Lt 08/28/22  Forearm supination 78   Forearm pronation  71   Wrist flexion 59 ***  Wrist extension 34 ***  (Blank rows = not tested)  Eval: Digits 1 through 3 are very stiff index finger being the worst, so OT measures this specifically today as an example of the worst of the motion.  Digits 4 and 5 have sensation and are moving much better, are not hypersensitive.  Thumb is somewhat stiff Hand AROM Left eval  Full Fist Ability (or Gap to Distal Palmar Crease) No- 8.7cm Gap to IF to [facility]  Thumb Opposition  (Kapandji Scale)  1 / 10  Thumb MCP (0-60)   Thumb IP (0-80)   Thumb Radial Abduction Span    Thumb Palmar Abduction Span    Index MCP (0-90) 0-  66  Index PIP (0-100) 0-  28  Index DIP (0-70)  0 - 0  (Blank rows = not tested)   UPPER EXTREMITY MMT:    Eval: Due to hyper-sensation/pain/allodynia OT did not push or pull to test muscle strength today.  Will be determined when appropriate MMT Left TBD  Elbow flexion   Elbow extension   Forearm  supination   Forearm pronation   Wrist flexion   Wrist extension   Wrist ulnar deviation   Wrist radial deviation   (Blank rows = not tested)  HAND FUNCTION: 08/28/22: *** Lt Grip strength today   Eval: Observed weakness in affected left hand.  Details to be determined Grip strength Right: TBD lbs, Left: TBD lbs   COORDINATION: 08/28/22: Box and Blocks Test Left: *** Blocks today (TBD is WFL)  Eval: Observed coordination impairments with affected left hand.  He could not make a fist or oppose to his index finger correctly, and details will be determined in future sessions   SENSATION: Eval: Light touch absent in Lt IF, thumb, MF tips- unable to  perceive Semmes-Weinstein monofilament 6.65- he was strongly urged to use caution with daily activities to prevent injury   EDEMA:   08/28/22: ***cm swelling circumferential around ***  Eval:  Mildly swollen in radial side of hand today, could be CRPS type response or simply from disuse from increased pain and allodynia  COGNITION: Eval: Overall cognitive status: WFL for evaluation today, though he was somewhat anxious and fearful due to pain symptoms  OBSERVATIONS:   Eval: He has overt signs of hypersensitivity or allodynia in the left wrist and hand through median nerve distribution only.  He also lacks full sensation of light touch in the tips of digits 1 through 3, even sensation of pressure is somewhat diminished. He is quite stiff and swollen through the left wrist and hand as well.    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   TODAY'S TREATMENT:  08/28/22: ***Check initial recommendations and HEP, upgrade to stretches at the forearm and wrist and hand as tolerated.  Also do functional activities for forced use of left hand   Post-evaluation treatment: OT first educates on the importance of touching his skin lightly, especially all around the nonsensitive or painful areas.  He had overt fever and flinching when OT attempts to touch his "painful" areas of his hand.  Eventually he tolerates a vibration for 2 to 3 minutes and OT shows him how to do progressively without adding to pain.  He states feeling better afterwards.  He is given the following exercise and nerve re-education as below and states understanding.  "Every 2-3 hours, I want you to touch your left arm, all around the areas where it hurts.  Start where it is easy and get close to where it hurts.  "Tease it."  Spend 2 minutes doing that.  You can use a towel or vibration too.    Try buying gloves or mittens  that are loose to keep your hand in.   Also do nerve glides 3-4 x day  (look at the picture)."  Exercises - Median Nerve Flossing  - 4-6 x daily - 1 sets - 10-15 reps   PATIENT EDUCATION: Education details: See tx section above for details  Person educated: Patient Education method: Verbal Instruction, Teach back, Handouts  Education comprehension: States and demonstrates understanding, Additional Education required    HOME EXERCISE PROGRAM: Access Code: [email or web address] URL: [email or web address] Date: 08/23/2022 Prepared by: Fannie Knee   GOALS: Goals reviewed with patient? Yes   SHORT TERM GOALS: (STG required if POC>30 days) Target Date: 09/07/22  Pt will obtain protective, custom orthotic. Goal status: TBD/PRN  2.  Pt will demo/state understanding of initial HEP to improve pain  levels and prerequisite motion. Goal status: INITIAL   LONG TERM GOALS: Target Date: 10/05/22  Pt will improve functional ability by decreased impairment per Quick DASH assessment from TBD to TBD or better, for better quality of life. Goal status: TBD next session when more time available  2.  Pt will improve grip strength in Lt hand from unable to at least 40lbs for functional use at home and in IADLs. Goal status: INITIAL  3.  Pt will improve A/ROM in Lt wrist flex/ext from 59*/34* to at least 65* both ways, to have functional motion for tasks like reach and grasp.  Goal status: INITIAL  4.  Pt will improve strength in Lt Wrist flex/ext from guarded and not using to at least 4/5 MMT to have increased functional ability to carry out selfcare and higher-level homecare tasks with no difficulty. Goal status: INITIAL  5.  Pt will improve coordination skills in Lt arm, as seen by [facility] score on box and blocks testing to have increased functional ability to carry out fine motor tasks (fasteners, etc.) and more complex, coordinated IADLs (meal prep, sports, etc.).  Goal status:  INITIAL  6.  Pt will decrease pain at worst from 8/10 to 2/10 or better to have better sleep and occupational participation in daily roles. Goal status: INITIAL   ASSESSMENT:  CLINICAL IMPRESSION: 08/28/22: ***  Eval: Patient is a 61 y.o. male who was seen today for occupational therapy evaluation for hypersensitive, painful, numb, stiff left hand and wrist following both bone forearm fracture and surgical intervention.  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  These things are significantly decreasing his functional ability and he will benefit from occupational therapy to improve that.    PLAN:  OT FREQUENCY: 1-2x/week  OT DURATION: 6 weeks (through 10/05/22 as needed)   PLANNED INTERVENTIONS: self care/ADL training, therapeutic exercise, therapeutic activity, neuromuscular re-education, manual therapy, scar mobilization, passive range of motion, splinting, electrical stimulation, ultrasound, fluidotherapy, compression bandaging, moist heat, cryotherapy, contrast bath, patient/family education, and coping strategies training  CONSULTED AND AGREED WITH PLAN OF CARE: Patient  PLAN FOR NEXT SESSION:  ***    Fannie Knee, OTR/L, CHT 08/24/2022, 10:17 AM

## 2022-08-28 ENCOUNTER — Encounter: Payer: Self-pay | Admitting: Rehabilitative and Restorative Service Providers"

## 2022-08-28 ENCOUNTER — Ambulatory Visit (INDEPENDENT_AMBULATORY_CARE_PROVIDER_SITE_OTHER): Payer: BLUE CROSS/BLUE SHIELD | Admitting: Rehabilitative and Restorative Service Providers"

## 2022-08-28 DIAGNOSIS — R600 Localized edema: Secondary | ICD-10-CM | POA: Diagnosis not present

## 2022-08-28 DIAGNOSIS — M25642 Stiffness of left hand, not elsewhere classified: Secondary | ICD-10-CM | POA: Diagnosis not present

## 2022-08-28 DIAGNOSIS — R278 Other lack of coordination: Secondary | ICD-10-CM

## 2022-08-28 DIAGNOSIS — M6281 Muscle weakness (generalized): Secondary | ICD-10-CM | POA: Diagnosis not present

## 2022-08-28 DIAGNOSIS — M25632 Stiffness of left wrist, not elsewhere classified: Secondary | ICD-10-CM | POA: Diagnosis not present

## 2022-08-28 DIAGNOSIS — R202 Paresthesia of skin: Secondary | ICD-10-CM

## 2022-08-28 DIAGNOSIS — M79642 Pain in left hand: Secondary | ICD-10-CM

## 2022-09-03 NOTE — Therapy (Signed)
OUTPATIENT OCCUPATIONAL THERAPY TREATMENT NOTE  Patient Name: Adam Jacobs MRN: [id number] DOB:03-10-1961, 62 y.o., male Today's Date: 09/04/2022  PCP: N/A REFERRING PROVIDER: Valeria Batman, MD   END OF SESSION:  OT End of Session - 09/04/22 1512     Visit Number 3    Number of Visits 10    Date for OT Re-Evaluation 10/05/22    Authorization Type [employer]    OT Start Time 1514    OT Stop Time 1559    OT Time Calculation (min) 45 min    Activity Tolerance No increased pain;Patient limited by pain;Patient limited by fatigue;Patient tolerated treatment well    Behavior During Therapy Anxious;WFL for tasks assessed/performed              History reviewed. No pertinent past medical history. Past Surgical History:  Procedure Laterality Date   WRIST SURGERY     Patient Active Problem List   Diagnosis Date Noted   Forearm fractures, both bones, closed, left, sequela 08/07/2022   Stomach ache 07/03/2019   Neck problem 07/03/2019   3 falls in previous year 07/03/2019   Constipation 07/03/2019   Hemorrhoids 07/03/2019   Swelling of throat 07/28/2016   Abscess of head 07/28/2016    ONSET DATE: DOI 05/21/22; DOS 05/22/22  REFERRING DIAG:  I71.245 (ICD-10-CM) - Pain in left hand  M25.532 (ICD-10-CM) - Pain in left wrist    THERAPY DIAG:  Stiffness of left wrist, not elsewhere classified  Stiffness of left hand, not elsewhere classified  Muscle weakness (generalized)  Localized edema  Pain in left hand  Paresthesia of skin  Other lack of coordination  Rationale for Evaluation and Treatment: Rehabilitation  PERTINENT HISTORY: 15 weeks post- op now.  This pt originally had a work injury, fracturing Lt radius and ulna on 05/21/22 which lead to irrigation and debridement, removal of old volar plate from old ulna shaft fx, and with definitive fixation of the both bone forearm fractures.  He has since gone to the ED and followed up with Dr. Cleophas Dunker with c/o swelling  in Lt hand/fingers, pain, stiffness, numbness in median N distribution, and possible RSD / CRPS symptoms.   PRECAUTIONS: None; WEIGHT BEARING RESTRICTIONS: No   SUBJECTIVE:   SUBJECTIVE STATEMENT: He states due to the rainy cold weather his hand has been hurting more recently, but he has been doing his home exercises trying to move and touching his skin 2-3 times a day he says.    PAIN:  Are you having pain?  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    FUNCTIONAL OUTCOME MEASURES: 08/28/22:  Nadean Corwin DASH 75% impairment today    (Higher % Score  =  More Impairment)    UPPER EXTREMITY ROM     Shoulder to Wrist AROM Left eval Lt 08/28/22 Lt 09/04/22  Forearm supination 78    Forearm pronation  71    Wrist flexion 59 60 57  Wrist extension 34 31 50  (Blank rows = not tested)  Eval: Digits 1 through 3 are very stiff index finger being the worst, so OT measures  this specifically today as an example of the worst of the motion.  Digits 4 and 5 have sensation and are moving much better, are not hypersensitive.  Thumb is somewhat stiff  Hand AROM Left eval Lt 09/04/22  Full Fist Ability (or Gap to Distal Palmar Crease) No- 8.7cm Gap to IF to [facility] 8.5cm gap  Thumb Opposition  (Kapandji Scale)  1 / 10 1/10  Thumb MCP (0-60)    Thumb IP (0-80)    Thumb Radial Abduction Span     Thumb Palmar Abduction Span     Index MCP (0-90) 0-  66 0 - 78  Index PIP (0-100) 0-  28 0 - 30  Index DIP (0-70)  0 - 0 0 - 0  (Blank rows = not tested)   UPPER EXTREMITY MMT:    Eval: Due  to hyper-sensation/pain/allodynia OT did not push or pull to test muscle strength today.  Will be determined when appropriate MMT Left TBD  Elbow flexion   Elbow extension   Forearm supination   Forearm pronation   Wrist flexion   Wrist extension   Wrist ulnar deviation   Wrist radial deviation   (Blank rows = not tested)  HAND FUNCTION: 08/28/22: 4# Lt Grip strength today; Rt grip: 110#  COORDINATION: 09/04/22: Box and Blocks Test Left: 25 Blocks today (47 is WFL)  08/28/22: Box and Blocks Test Left: 14 Blocks today (47 is WFL)  Eval: Observed coordination impairments with affected left hand.  He could not make a fist or oppose to his index finger correctly, and details will be determined in future sessions   SENSATION: Eval: Light touch absent in Lt IF, thumb, MF tips- unable to perceive Semmes-Weinstein monofilament 6.65- he was strongly urged to use caution with daily activities to prevent injury   EDEMA:   08/28/22: 7.5cm swelling circumferential around Lt prox IF (other hand is 7cm)   Eval:  Mildly swollen in radial side of hand today, could be CRPS type response or simply from disuse from increased pain and allodynia   OBSERVATIONS:   Eval: He has overt signs of hypersensitivity or allodynia in the left wrist and hand through median nerve distribution only.  He also lacks full sensation of light touch in the tips of digits 1 through 3, even sensation of pressure is somewhat diminished. He is quite stiff and swollen through the left wrist and hand as well.    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   TODAY'S TREATMENT:  09/04/22: He starts with new active range of motion which shows slightly tighter flexion but improved extension significantly at the wrist as well as slightly better motion in the index finger.  DIP joint is still not moving so OT adds DIP  joint blocking flexion activity to his homework today and he performs in clinic 10 times with no significant pain or problems.  OT also reviews the rest of his HEP with him which she states able to perform, and also and this finger abduction strength with rubber band as he does have some wasting of the first dorsal interossei or possibly abductor pollicis.  It would be strange to have wasting of adductor pollicis as this is ulnar nerve innervated, but it could still be possible.  Other intrinsic muscles look well innervated and not atrophied today.  OT also does manual therapy scar mobilization to the  volar scars that are very sensitive and painful entrapping the median nerve.  He has some pain with this especially with cupping, but pain resolves by the end of session with no other pain interventions.  OT also uses Dycem to assist with scar notching and scar rolling as patient tolerates and does a review of this for his home exercise program.  He states no significant pain at end of session and understanding this plan of care  New HEP Exercises - Tip Joint Blocking Motion  - 4-6 x daily - 10-15 reps - 3 sec hold - Finger Spread  - 2-3 x daily - 5 reps   PATIENT EDUCATION: Education details: See tx section above for details  Person educated: Patient Education method: Verbal Instruction, Teach back, Handouts  Education comprehension: States and demonstrates understanding, Additional Education required    HOME EXERCISE PROGRAM: Access Code: [email or web address] URL: [email or web address] Date: 08/23/2022 Prepared by: Fannie Knee   GOALS: Goals reviewed with patient? Yes   SHORT TERM GOALS: (STG required if POC>30 days) Target Date: 09/07/22  Pt will obtain protective, custom orthotic. Goal status: TBD/PRN  2.  Pt will demo/state understanding of initial HEP to improve pain levels and prerequisite motion. Goal status: Met 09/04/22   LONG TERM GOALS: Target Date: 10/05/22  Pt will  improve functional ability by decreased impairment per Quick DASH assessment from 75% impaired to 30% impaired or better, for better quality of life. Goal status: INITIAL  2.  Pt will improve grip strength in Lt hand from unable to at least 40lbs for functional use at home and in IADLs. Goal status: INITIAL  3.  Pt will improve A/ROM in Lt wrist flex/ext from 59*/34* to at least 65* both ways, to have functional motion for tasks like reach and grasp.  Goal status: INITIAL  4.  Pt will improve strength in Lt Wrist flex/ext from guarded and not using to at least 4/5 MMT to have increased functional ability to carry out selfcare and higher-level homecare tasks with no difficulty. Goal status: INITIAL  5.  Pt will improve coordination skills in Lt arm, as seen by [facility] score on box and blocks testing to have increased functional ability to carry out fine motor tasks (fasteners, etc.) and more complex, coordinated IADLs (meal prep, sports, etc.).  Goal status: INITIAL  6.  Pt will decrease pain at worst from 8/10 to 2/10 or better to have better sleep and occupational participation in daily roles. Goal status: INITIAL   ASSESSMENT:  CLINICAL IMPRESSION: 09/04/22: As his hand is still stubbornly stiff and tight actively and passively, OT will likely fabricate some type of immobilization orthosis in the next session for him to wear 4-6 times a day at home for 15-minute periods to increase PROM of index middle and thumb.  Otherwise we will continue plan of care-mobilizing tissues and nerves attempt strengthening etc.  08/28/22: He is improving, though slowly, the use and tolerance to touch and motion in left hand and arm now.  Continue plan of care and also consider dynamic mobilization orthoses as needed if more conservative treatment does not suffice.  PLAN:  OT FREQUENCY: 1-2x/week  OT DURATION: 6 weeks (through 10/05/22 as needed)   PLANNED INTERVENTIONS: self care/ADL training, therapeutic  exercise, therapeutic activity, neuromuscular re-education, manual therapy, scar mobilization, passive range of motion, splinting, electrical stimulation, ultrasound, fluidotherapy, compression bandaging, moist heat, cryotherapy, contrast bath, patient/family education, and coping strategies training  CONSULTED AND AGREED WITH PLAN OF CARE: Patient  PLAN  FOR NEXT SESSION:  Make dynamic orthotic to assist with PROM finger flexion of first 3 digits.  Review home exercises and continue with manual therapy as needed.  Continue cupping as tolerated   Fannie Knee, OTR/L, CHT 09/04/2022, 4:10 PM

## 2022-09-04 ENCOUNTER — Encounter: Payer: Self-pay | Admitting: Rehabilitative and Restorative Service Providers"

## 2022-09-04 ENCOUNTER — Ambulatory Visit (INDEPENDENT_AMBULATORY_CARE_PROVIDER_SITE_OTHER): Payer: BLUE CROSS/BLUE SHIELD | Admitting: Rehabilitative and Restorative Service Providers"

## 2022-09-04 DIAGNOSIS — M25632 Stiffness of left wrist, not elsewhere classified: Secondary | ICD-10-CM

## 2022-09-04 DIAGNOSIS — R202 Paresthesia of skin: Secondary | ICD-10-CM

## 2022-09-04 DIAGNOSIS — R600 Localized edema: Secondary | ICD-10-CM

## 2022-09-04 DIAGNOSIS — R278 Other lack of coordination: Secondary | ICD-10-CM

## 2022-09-04 DIAGNOSIS — M6281 Muscle weakness (generalized): Secondary | ICD-10-CM | POA: Diagnosis not present

## 2022-09-04 DIAGNOSIS — M25642 Stiffness of left hand, not elsewhere classified: Secondary | ICD-10-CM | POA: Diagnosis not present

## 2022-09-04 DIAGNOSIS — M79642 Pain in left hand: Secondary | ICD-10-CM

## 2022-09-07 NOTE — Therapy (Addendum)
OUTPATIENT OCCUPATIONAL THERAPY TREATMENT & DISCHARGE NOTE  Patient Name: Adam Jacobs MRN: [id number] DOB:Jul 02, 1961, 62 y.o., male Today's Date: 09/11/2022  PCP: N/A REFERRING PROVIDER: Garald Balding, MD   END OF SESSION:  OT End of Session - 09/11/22 1514     Visit Number 4    Number of Visits 10    Date for OT Re-Evaluation 10/05/22    Authorization Type [employer]    OT Start Time 1515    OT Stop Time 1603    OT Time Calculation (min) 48 min    Equipment Utilized During Treatment orthotic materials    Activity Tolerance No increased pain;Patient limited by pain;Patient limited by fatigue;Patient tolerated treatment well    Behavior During Therapy Anxious;WFL for tasks assessed/performed              History reviewed. No pertinent past medical history. Past Surgical History:  Procedure Laterality Date   WRIST SURGERY     Patient Active Problem List   Diagnosis Date Noted   Forearm fractures, both bones, closed, left, sequela 08/07/2022   Stomach ache 07/03/2019   Neck problem 07/03/2019   3 falls in previous year 07/03/2019   Constipation 07/03/2019   Hemorrhoids 07/03/2019   Swelling of throat 07/28/2016   Abscess of head 07/28/2016    ONSET DATE: DOI 05/21/22; DOS 05/22/22  REFERRING DIAG:  [id number] (ICD-10-CM) - Pain in left hand  M25.532 (ICD-10-CM) - Pain in left wrist    THERAPY DIAG:  Stiffness of left wrist, not elsewhere classified  Muscle weakness (generalized)  Pain in left hand  Stiffness of left hand, not elsewhere classified  Localized edema  Paresthesia of skin  Other lack of coordination  Rationale for Evaluation and Treatment: Rehabilitation  PERTINENT HISTORY: 16 weeks post- op now.  This pt originally had a work injury, fracturing Lt radius and ulna on 05/21/22 which lead to irrigation and debridement, removal of old volar plate from old ulna shaft fx, and with definitive fixation of the both bone forearm fractures.  He has  since gone to the ED and followed up with Dr. Durward Fortes with c/o swelling in Lt hand/fingers, pain, stiffness, numbness in median N distribution, and possible RSD / CRPS symptoms.   PRECAUTIONS: None; WEIGHT BEARING RESTRICTIONS: No   SUBJECTIVE:   SUBJECTIVE STATEMENT: He states touching water still hurts his skin.  He also continues to notice that his volar forearm scar is sensitive and seems to be the place where his nerve is stuck and painful.    PAIN:  Are you having pain?  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    FUNCTIONAL OUTCOME MEASURES: 08/28/22:  [name] DASH 75% impairment today    (Higher % Score  =  More Impairment)    UPPER EXTREMITY ROM     Shoulder to Wrist AROM Left eval Lt 08/28/22 Lt 09/04/22  Forearm supination 78    Forearm pronation  71    Wrist flexion 59 60 57  Wrist extension 34 31 50  (Blank rows = not tested)  Eval: Digits 1 through 3 are very  stiff index finger being the worst, so OT measures this specifically today as an example of the worst of the motion.  Digits 4 and 5 have sensation and are moving much better, are not hypersensitive.  Thumb is somewhat stiff  Hand AROM Left eval Lt 09/04/22 Lt 09/11/22  Full Fist Ability (or Gap to Distal Palmar Crease) No- 8.7cm Gap to IF to [facility] 8.5cm gap   Thumb Opposition  (Kapandji Scale)  1 / 10 1/10   Thumb MCP (0-60)     Thumb IP (0-80)     Thumb Radial Abduction Span      Thumb Palmar Abduction Span      Index MCP (0-90) 0-  66 0 - 78 0 - 72  Index PIP (0-100) 0-  28 0 - 30 0 - 41   Index DIP (0-70)  0 - 0 0 - 0 0 - 6  (Blank rows = not tested)   UPPER EXTREMITY MMT:    Eval: Due to hyper-sensation/pain/allodynia OT did not push or pull to test muscle strength today.  Will be determined when appropriate MMT Left TBD  Elbow flexion   Elbow extension   Forearm supination   Forearm pronation   Wrist flexion   Wrist extension   Wrist ulnar deviation   Wrist radial deviation   (Blank rows = not tested)  HAND FUNCTION: 08/28/22: 4# Lt Grip strength today; Rt grip: 110#  COORDINATION: 09/04/22: Box and Blocks Test Left: 25 Blocks today (47 is WFL)  08/28/22: Box and Blocks Test Left: 14 Blocks today (47 is WFL)  Eval: Observed coordination impairments with affected left hand.  He could not make a fist or oppose to his index finger correctly, and details will be determined in future sessions   SENSATION: Eval: Light touch absent in Lt IF, thumb, MF tips- unable to perceive Semmes-Weinstein monofilament 6.65- he was strongly urged to use caution with daily activities to prevent injury   EDEMA:   08/28/22: 7.5cm swelling circumferential around Lt prox IF (other hand is 7cm)   Eval:  Mildly swollen in radial side of hand today, could be CRPS type response or simply from disuse from increased pain and allodynia   OBSERVATIONS:   09/11/22: Unfortunately, his sensitivity remains, and he presents more and more like a median nerve entrapment that is causing lack of sensation and motor control to median nerve distribution.  Eval: He has overt signs of hypersensitivity or allodynia in the left wrist and hand through median nerve distribution only.  He also lacks full sensation of light touch in the tips of digits 1 through 3, even sensation of pressure is somewhat diminished. He is quite stiff and swollen through the left wrist and hand as well.  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   TODAY'S TREATMENT:  09/11/22: Due to continued stiffness and stubborn motion, possibly from hypersensitivity and disuse as well, today OT constructed dynamic orthotic to assist with the passive range of motion and active range of motion of finger flexion for digits 1 through 3.  It fits well and applies no pain or pressure at the moment, and he demonstrates donning and doffing without significant difficulties.  This takes the majority of today's session, however OT also has time to review home exercise program for stretches and blocking exercises with him which she demos back well.  He  is told to wear the new orthotic to mobilize his hand at least 3-4 times a day for 15 to 20 minutes as tolerated without pain.  He should also continue his home exercise program 3-4 times a day at least as able and do desensitization 4-6 times a day for 2-minute periods as able as well.  He states understanding.   09/04/22: He starts with new active range of motion which shows slightly tighter flexion but improved extension significantly at the wrist as well as slightly better motion in the index finger.  DIP joint is still not moving so OT adds DIP joint blocking flexion activity to his homework today and he performs in clinic 10 times with no significant pain or problems.  OT also reviews the rest of his HEP with him which she states able to perform, and also and this finger abduction strength with rubber band as he does have some wasting of the first dorsal interossei or possibly abductor pollicis.  It would be strange to have wasting of adductor pollicis as this is ulnar nerve innervated, but it could still be possible.  Other intrinsic muscles look well innervated and not atrophied today.  OT also does manual therapy scar mobilization to the volar scars that are very sensitive and painful entrapping the median nerve.  He has some pain with this especially with cupping, but pain resolves by  the end of session with no other pain interventions.  OT also uses Dycem to assist with scar notching and scar rolling as patient tolerates and does a review of this for his home exercise program.  He states no significant pain at end of session and understanding this plan of care  New HEP Exercises - Tip Joint Blocking Motion  - 4-6 x daily - 10-15 reps - 3 sec hold - Finger Spread  - 2-3 x daily - 5 reps   PATIENT EDUCATION: Education details: See tx section above for details  Person educated: Patient Education method: Verbal Instruction, Teach back, Handouts  Education comprehension: States and demonstrates understanding, Additional Education required    HOME EXERCISE PROGRAM: Access Code: [email or web address] URL: [email or web address] Date: 08/23/2022 Prepared by: Benito Mccreedy   GOALS: Goals reviewed with patient? Yes   SHORT TERM GOALS: (STG required if POC>30 days) Target Date: 09/07/22  Pt will obtain protective, custom orthotic. Goal status: 09/11/22: MET  2.  Pt will demo/state understanding of initial HEP to improve pain levels and prerequisite motion. Goal status: Met 09/04/22   LONG TERM GOALS: Target Date: 10/05/22  Pt will improve functional ability by decreased impairment per Quick DASH assessment from 75% impaired to 30% impaired or better, for better quality of life. Goal status: INITIAL  2.  Pt will improve grip strength in Lt hand from unable to at least 40lbs for functional use at home and in IADLs. Goal status: INITIAL  3.  Pt will improve A/ROM in Lt wrist flex/ext from 59*/34* to at least 65* both ways, to have functional motion for tasks like reach and grasp.  Goal status: INITIAL  4.  Pt will improve strength in Lt Wrist flex/ext from guarded and not using to at least 4/5 MMT to have increased functional ability to carry out selfcare and higher-level homecare tasks with no difficulty. Goal status: INITIAL  5.  Pt will improve coordination  skills in Lt arm, as seen by [facility] score on box and blocks testing to have increased functional ability to carry out fine motor tasks (fasteners, etc.) and  more complex, coordinated IADLs (meal prep, sports, etc.).  Goal status: INITIAL  6.  Pt will decrease pain at worst from 8/10 to 2/10 or better to have better sleep and occupational participation in daily roles. Goal status: INITIAL   ASSESSMENT:  CLINICAL IMPRESSION: 09/11/22: Continue to work toward motion and functional strength and ability with left hand and arm per plan of care. Unfortunately, if this is a pure nerve injury, conservative management (orthotic mobilization, scar mobilization, desensitization, exercises, etc.) may not be effective at helping him regain neuromuscular nerve activation of the median nerve- and it may require surgical intervention to free the nerve from the scar in forearm or other approaches.   PLAN:  OT FREQUENCY: 1-2x/week  OT DURATION: 6 weeks (through 10/05/22 as needed)   PLANNED INTERVENTIONS: self care/ADL training, therapeutic exercise, therapeutic activity, neuromuscular re-education, manual therapy, scar mobilization, passive range of motion, splinting, electrical stimulation, ultrasound, fluidotherapy, compression bandaging, moist heat, cryotherapy, contrast bath, patient/family education, and coping strategies training  CONSULTED AND AGREED WITH PLAN OF CARE: Patient  PLAN FOR NEXT SESSION:  Check new dynamic orthotic for fit and use as needed, continue manual therapy and cupping, continue functional activities and forced use of left hand as tolerated. Consider referral to hand or nerve surgeon to consider surgically freeing the entrapped nerve.    Fannie Knee, OTR/L, CHT 09/11/2022, 4:30 PM   OCCUPATIONAL THERAPY DISCHARGE SUMMARY  Visits from Start of Care: 4  The patient changed his insurance and now is out of network here.  He elects to be discharged from this plan of care to avoid  having large bills.  I did refer him to the [facility] [place] [facility] [employer] which accepts his new insurance.  He was given information on this in the fundus to help him get a new referral.  Goals could not be addressed as he did not have any future treatments or sessions.  Fannie Knee, OTR/L, CHT 09/25/22

## 2022-09-11 ENCOUNTER — Encounter: Payer: Self-pay | Admitting: Rehabilitative and Restorative Service Providers"

## 2022-09-11 ENCOUNTER — Ambulatory Visit: Payer: BLUE CROSS/BLUE SHIELD | Admitting: Rehabilitative and Restorative Service Providers"

## 2022-09-11 DIAGNOSIS — M79642 Pain in left hand: Secondary | ICD-10-CM | POA: Diagnosis not present

## 2022-09-11 DIAGNOSIS — M25642 Stiffness of left hand, not elsewhere classified: Secondary | ICD-10-CM

## 2022-09-11 DIAGNOSIS — R600 Localized edema: Secondary | ICD-10-CM

## 2022-09-11 DIAGNOSIS — M25632 Stiffness of left wrist, not elsewhere classified: Secondary | ICD-10-CM | POA: Diagnosis not present

## 2022-09-11 DIAGNOSIS — M6281 Muscle weakness (generalized): Secondary | ICD-10-CM

## 2022-09-11 DIAGNOSIS — R278 Other lack of coordination: Secondary | ICD-10-CM

## 2022-09-11 DIAGNOSIS — R202 Paresthesia of skin: Secondary | ICD-10-CM

## 2022-09-17 NOTE — Therapy (Incomplete)
OUTPATIENT OCCUPATIONAL THERAPY TREATMENT NOTE  Patient Name: Adam Jacobs MRN: [id number] DOB:04-Jun-1961, 62 y.o., male Today's Date: 09/18/2022  PCP: N/A REFERRING PROVIDER: Garald Balding, MD   END OF SESSION:     No past medical history on file. Past Surgical History:  Procedure Laterality Date   WRIST SURGERY     Patient Active Problem List   Diagnosis Date Noted   Forearm fractures, both bones, closed, left, sequela 08/07/2022   Stomach ache 07/03/2019   Neck problem 07/03/2019   3 falls in previous year 07/03/2019   Constipation 07/03/2019   Hemorrhoids 07/03/2019   Swelling of throat 07/28/2016   Abscess of head 07/28/2016    ONSET DATE: DOI 05/21/22; DOS 05/22/22  REFERRING DIAG:  V56.433 (ICD-10-CM) - Pain in left hand  M25.532 (ICD-10-CM) - Pain in left wrist    THERAPY DIAG:  No diagnosis found.  Rationale for Evaluation and Treatment: Rehabilitation  PERTINENT HISTORY: 17 weeks post- op now.  This pt originally had a work injury, fracturing Lt radius and ulna on 05/21/22 which lead to irrigation and debridement, removal of old volar plate from old ulna shaft fx, and with definitive fixation of the both bone forearm fractures.  He has since gone to the ED and followed up with Adam Jacobs with c/o swelling in Lt hand/fingers, pain, stiffness, numbness in median N distribution, and possible RSD / CRPS symptoms.   PRECAUTIONS: None; WEIGHT BEARING RESTRICTIONS: No   SUBJECTIVE:   SUBJECTIVE STATEMENT: He states ***  touching water still hurts his skin.  He also continues to notice that his volar forearm scar is sensitive and seems to be the place where his nerve is stuck and painful.    PAIN:  Are you having pain? 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    FUNCTIONAL OUTCOME MEASURES: 08/28/22:  [name] DASH 75% impairment today    (Higher % Score  =  More Impairment)    UPPER EXTREMITY ROM     Shoulder to Wrist AROM Left eval Lt 08/28/22 Lt 09/04/22 Lt 09/18/22  Forearm supination 78     Forearm pronation  71     Wrist flexion 59 60 57 ***  Wrist extension 34 31 50 ***  (Blank rows = not tested)  Eval: Digits 1 through 3 are very stiff index finger being the worst, so OT measures this specifically today as an example of the worst of the motion.  Digits 4 and 5 have sensation and are moving much better, are not hypersensitive.  Thumb is somewhat stiff  Hand AROM Left eval Lt 09/04/22 Lt 09/11/22 Lt 09/18/22  Full Fist Ability (or Gap to Distal Palmar Crease) No- 8.7cm Gap to IF to [name] 8.5cm gap  ***cm gap  Thumb Opposition  (Kapandji Scale)  1 / 10 1/10  ***  Thumb MCP (0-60)      Thumb IP (0-80)      Thumb Radial Abduction Span       Thumb Palmar Abduction Span       Index MCP (0-90) 0-  66 0 -  78 0 - 72 0 - ***  Index PIP (0-100) 0-  28 0 - 30 0 - 41 0 - ***  Index DIP (0-70)  0 - 0 0 - 0 0 - 6 0 - ***  (Blank rows = not tested)   UPPER EXTREMITY MMT:    Eval: Due to hyper-sensation/pain/allodynia OT did not push or pull to test muscle strength today.  Will be determined when appropriate MMT Left TBD  Elbow flexion   Elbow extension   Forearm supination   Forearm pronation   Wrist flexion   Wrist extension   Wrist ulnar deviation   Wrist radial deviation   (Blank rows = not tested)  HAND FUNCTION: 09/18/22: Lt grip: ***#   08/28/22: 4# Lt Grip strength today; Rt grip: 110#  COORDINATION: 09/18/22: Box and Blocks Test Left: *** Blocks today (47 is WFL)  09/04/22: Box and Blocks Test  Left: 25 Blocks today (47 is WFL)  08/28/22: Box and Blocks Test Left: 14 Blocks today (47 is WFL)  Eval: Observed coordination impairments with affected left hand.  He could not make a fist or oppose to his index finger correctly, and details will be determined in future sessions   SENSATION: Eval: Light touch absent in Lt IF, thumb, MF tips- unable to perceive Semmes-Weinstein monofilament 6.65- he was strongly urged to use caution with daily activities to prevent injury   EDEMA:   08/28/22: 7.5cm swelling circumferential around Lt prox IF (other hand is 7cm)   Eval:  Mildly swollen in radial side of hand today, could be CRPS type response or simply from disuse from increased pain and allodynia   OBSERVATIONS:   09/11/22: Unfortunately, his sensitivity remains, and he presents more and more like a median nerve entrapment that is causing lack of sensation and motor control to median nerve distribution.  Eval: He has overt signs of hypersensitivity or allodynia in the left wrist and hand through median nerve distribution only.  He also lacks full sensation of light touch in the tips of digits 1 through 3, even sensation of pressure is somewhat diminished. He is quite stiff and swollen through the left wrist and hand as well.  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   TODAY'S TREATMENT:  09/18/22: ***Check new dynamic orthotic for fit and use as needed, continue manual therapy and cupping, continue functional activities and forced use of left hand as tolerated. Consider referral to hand or nerve surgeon to consider surgically freeing the entrapped nerve.    09/11/22: Due to continued stiffness and stubborn motion, possibly from hypersensitivity and disuse as well, today OT constructed dynamic orthotic to assist with the passive range of motion and active range of motion of  finger flexion for digits 1 through 3.  It fits well and applies no pain or pressure at the moment, and he demonstrates donning and doffing without significant difficulties.  This takes the majority of today's session, however OT also has time to review home exercise program for stretches and blocking exercises with him which she demos back well.  He is told to wear the new orthotic to mobilize his hand at least 3-4 times a day for 15 to 20 minutes as tolerated without pain.  He should also continue his home exercise program 3-4 times a day at least as able and do desensitization 4-6 times a day for  2-minute periods as able as well.  He states understanding.   09/04/22: He starts with new active range of motion which shows slightly tighter flexion but improved extension significantly at the wrist as well as slightly better motion in the index finger.  DIP joint is still not moving so OT adds DIP joint blocking flexion activity to his homework today and he performs in clinic 10 times with no significant pain or problems.  OT also reviews the rest of his HEP with him which she states able to perform, and also and this finger abduction strength with rubber band as he does have some wasting of the first dorsal interossei or possibly abductor pollicis.  It would be strange to have wasting of adductor pollicis as this is ulnar nerve innervated, but it could still be possible.  Other intrinsic muscles look well innervated and not atrophied today.  OT also does manual therapy scar mobilization to the volar scars that are very sensitive and painful entrapping the median nerve.  He has some pain with this especially with cupping, but pain resolves by the end of session with no other pain interventions.  OT also uses Dycem to assist with scar notching and scar rolling as patient tolerates and does a review of this for his home exercise program.  He states no significant pain at end of session and understanding this plan of  care  New HEP Exercises - Tip Joint Blocking Motion  - 4-6 x daily - 10-15 reps - 3 sec hold - Finger Spread  - 2-3 x daily - 5 reps   PATIENT EDUCATION: Education details: See tx section above for details  Person educated: Patient Education method: Verbal Instruction, Teach back, Handouts  Education comprehension: States and demonstrates understanding, Additional Education required    HOME EXERCISE PROGRAM: Access Code: [email or web address] URL: [email or web address] Date: 08/23/2022 Prepared by: Fannie Knee   GOALS: Goals reviewed with patient? Yes   SHORT TERM GOALS: (STG required if POC>30 days) Target Date: 09/07/22  Pt will obtain protective, custom orthotic. Goal status: 09/11/22: MET  2.  Pt will demo/state understanding of initial HEP to improve pain levels and prerequisite motion. Goal status: Met 09/04/22   LONG TERM GOALS: Target Date: 10/05/22  Pt will improve functional ability by decreased impairment per Quick DASH assessment from 75% impaired to 30% impaired or better, for better quality of life. Goal status: INITIAL  2.  Pt will improve grip strength in Lt hand from unable to at least 40lbs for functional use at home and in IADLs. Goal status: INITIAL  3.  Pt will improve A/ROM in Lt wrist flex/ext from 59*/34* to at least 65* both ways, to have functional motion for tasks like reach and grasp.  Goal status: INITIAL  4.  Pt will improve strength in Lt Wrist flex/ext from guarded and not using to at least 4/5 MMT to have increased functional ability to carry out selfcare and higher-level homecare tasks with no difficulty. Goal status: INITIAL  5.  Pt will improve coordination skills in Lt arm, as seen by [facility] score on box and blocks testing to have increased functional ability to carry out fine motor tasks (fasteners, etc.) and more complex, coordinated IADLs (meal prep, sports, etc.).  Goal status: INITIAL  6.  Pt will decrease pain at worst from  8/10 to 2/10 or better to have better sleep and occupational participation in daily roles. Goal status: INITIAL   ASSESSMENT:  CLINICAL IMPRESSION: 09/17/21: ***  09/11/22: Continue  to work toward motion and functional strength and ability with left hand and arm per plan of care. Unfortunately, if this is a pure nerve injury, conservative management (orthotic mobilization, scar mobilization, desensitization, exercises, etc.) may not be effective at helping him regain neuromuscular nerve activation of the median nerve- and it may require surgical intervention to free the nerve from the scar in forearm or other approaches.   PLAN:  OT FREQUENCY: 1-2x/week  OT DURATION: 6 weeks (through 10/05/22 as needed)   PLANNED INTERVENTIONS: self care/ADL training, therapeutic exercise, therapeutic activity, neuromuscular re-education, manual therapy, scar mobilization, passive range of motion, splinting, electrical stimulation, ultrasound, fluidotherapy, compression bandaging, moist heat, cryotherapy, contrast bath, patient/family education, and coping strategies training  CONSULTED AND AGREED WITH PLAN OF CARE: Patient  PLAN FOR NEXT SESSION:  ***   Fannie Knee, OTR/L, CHT 09/18/2022, 3:29 PM

## 2022-09-18 ENCOUNTER — Ambulatory Visit: Payer: Commercial Managed Care - HMO | Admitting: Orthopaedic Surgery

## 2022-09-18 ENCOUNTER — Encounter: Payer: BLUE CROSS/BLUE SHIELD | Admitting: Rehabilitative and Restorative Service Providers"

## 2022-09-24 ENCOUNTER — Telehealth: Payer: Self-pay

## 2022-09-24 NOTE — Telephone Encounter (Signed)
[facility] is faxing requesting medical records for patient. We have no medical records for patient because patient has never been seen. Patient No show appointment on 05/12/20 01/21/20 01/18/20 09/24/19. Faxed this back to [place] at [phone]

## 2022-09-24 NOTE — Therapy (Incomplete)
OUTPATIENT OCCUPATIONAL THERAPY TREATMENT NOTE  Patient Name: Adam Jacobs MRN: [id number] DOB:03/14/1961, 62 y.o., male Today's Date: 09/18/2022  PCP: N/A REFERRING PROVIDER: Valeria Batman, MD   END OF SESSION:     No past medical history on file. Past Surgical History:  Procedure Laterality Date   WRIST SURGERY     Patient Active Problem List   Diagnosis Date Noted   Forearm fractures, both bones, closed, left, sequela 08/07/2022   Stomach ache 07/03/2019   Neck problem 07/03/2019   3 falls in previous year 07/03/2019   Constipation 07/03/2019   Hemorrhoids 07/03/2019   Swelling of throat 07/28/2016   Abscess of head 07/28/2016    ONSET DATE: DOI 05/21/22; DOS 05/22/22  REFERRING DIAG:  E72.094 (ICD-10-CM) - Pain in left hand  M25.532 (ICD-10-CM) - Pain in left wrist    THERAPY DIAG:  No diagnosis found.  Rationale for Evaluation and Treatment: Rehabilitation  PERTINENT HISTORY: 18+ weeks post- op now.  This pt originally had a work injury, fracturing Lt radius and ulna on 05/21/22 which lead to irrigation and debridement, removal of old volar plate from old ulna shaft fx, and with definitive fixation of the both bone forearm fractures.  He has since gone to the ED and followed up with Dr. Cleophas Jacobs with c/o swelling in Lt hand/fingers, pain, stiffness, numbness in median N distribution, and possible RSD / CRPS symptoms.   PRECAUTIONS: None; WEIGHT BEARING RESTRICTIONS: No   SUBJECTIVE:   SUBJECTIVE STATEMENT: He states ***  touching water still hurts his skin.  He also continues to notice that his volar forearm scar is sensitive and seems to be the place where his nerve is stuck and painful.    PAIN:  Are you having pain? 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    FUNCTIONAL OUTCOME MEASURES: 08/28/22:  Adam Jacobs DASH 75% impairment today    (Higher % Score  =  More Impairment)    UPPER EXTREMITY ROM     Shoulder to Wrist AROM Left eval Lt 08/28/22 Lt 09/04/22 Lt 09/25/22  Forearm supination 78     Forearm pronation  71     Wrist flexion 59 60 57 ***  Wrist extension 34 31 50 ***  (Blank rows = not tested)  Eval: Digits 1 through 3 are very stiff index finger being the worst, so OT measures this specifically today as an example of the worst of the motion.  Digits 4 and 5 have sensation and are moving much better, are not hypersensitive.  Thumb is somewhat stiff  Hand AROM Left eval Lt 09/04/22 Lt 09/11/22 Lt 09/25/22  Full Fist Ability (or Gap to Distal Palmar Crease) No- 8.7cm Gap to IF to [facility] 8.5cm gap  ***cm gap  Thumb Opposition  (Kapandji Scale)  1 / 10 1/10  ***  Thumb MCP (0-60)      Thumb IP (0-80)      Thumb Radial Abduction Span       Thumb Palmar Abduction Span       Index MCP (0-90) 0-  66 0 -  78 0 - 72 0 - ***  Index PIP (0-100) 0-  28 0 - 30 0 - 41 0 - ***  Index DIP (0-70)  0 - 0 0 - 0 0 - 6 0 - ***  (Blank rows = not tested)   UPPER EXTREMITY MMT:    Eval: Due to hyper-sensation/pain/allodynia OT did not push or pull to test muscle strength today.  Will be determined when appropriate MMT Left TBD  Elbow flexion   Elbow extension   Forearm supination   Forearm pronation   Wrist flexion   Wrist extension   Wrist ulnar deviation   Wrist radial deviation   (Blank rows = not tested)  HAND FUNCTION: 09/25/22: Lt grip: ***#   08/28/22: 4# Lt Grip strength today; Rt grip: 110#  COORDINATION: 09/25/22: Box and Blocks Test Left: *** Blocks today (47 is WFL)  09/04/22: Box and Blocks Test  Left: 25 Blocks today (47 is WFL)  08/28/22: Box and Blocks Test Left: 14 Blocks today (47 is WFL)  Eval: Observed coordination impairments with affected left hand.  He could not make a fist or oppose to his index finger correctly, and details will be determined in future sessions   SENSATION: Eval: Light touch absent in Lt IF, thumb, MF tips- unable to perceive Semmes-Weinstein monofilament 6.65- he was strongly urged to use caution with daily activities to prevent injury   EDEMA:   08/28/22: 7.5cm swelling circumferential around Lt prox IF (other hand is 7cm)   Eval:  Mildly swollen in radial side of hand today, could be CRPS type response or simply from disuse from increased pain and allodynia   OBSERVATIONS:   09/11/22: Unfortunately, his sensitivity remains, and he presents more and more like a median nerve entrapment that is causing lack of sensation and motor control to median nerve distribution.  Eval: He has overt signs of hypersensitivity or allodynia in the left wrist and hand through median nerve distribution only.  He also lacks full sensation of light touch in the tips of digits 1 through 3, even sensation of pressure is somewhat diminished. He is quite stiff and swollen through the left wrist and hand as well.  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   TODAY'S TREATMENT:  09/25/22: ***Check new dynamic orthotic for fit and use as needed, continue manual therapy and cupping, continue functional activities and forced use of left hand as tolerated. Consider referral to hand or nerve surgeon to consider surgically freeing the entrapped nerve.    09/11/22: Due to continued stiffness and stubborn motion, possibly from hypersensitivity and disuse as well, today OT constructed dynamic orthotic to assist with the passive range of motion and active range of motion of  finger flexion for digits 1 through 3.  It fits well and applies no pain or pressure at the moment, and he demonstrates donning and doffing without significant difficulties.  This takes the majority of today's session, however OT also has time to review home exercise program for stretches and blocking exercises with him which she demos back well.  He is told to wear the new orthotic to mobilize his hand at least 3-4 times a day for 15 to 20 minutes as tolerated without pain.  He should also continue his home exercise program 3-4 times a day at least as able and do desensitization 4-6 times a day for  2-minute periods as able as well.  He states understanding.   09/04/22: He starts with new active range of motion which shows slightly tighter flexion but improved extension significantly at the wrist as well as slightly better motion in the index finger.  DIP joint is still not moving so OT adds DIP joint blocking flexion activity to his homework today and he performs in clinic 10 times with no significant pain or problems.  OT also reviews the rest of his HEP with him which she states able to perform, and also and this finger abduction strength with rubber band as he does have some wasting of the first dorsal interossei or possibly abductor pollicis.  It would be strange to have wasting of adductor pollicis as this is ulnar nerve innervated, but it could still be possible.  Other intrinsic muscles look well innervated and not atrophied today.  OT also does manual therapy scar mobilization to the volar scars that are very sensitive and painful entrapping the median nerve.  He has some pain with this especially with cupping, but pain resolves by the end of session with no other pain interventions.  OT also uses Dycem to assist with scar notching and scar rolling as patient tolerates and does a review of this for his home exercise program.  He states no significant pain at end of session and understanding this plan of  care  New HEP Exercises - Tip Joint Blocking Motion  - 4-6 x daily - 10-15 reps - 3 sec hold - Finger Spread  - 2-3 x daily - 5 reps   PATIENT EDUCATION: Education details: See tx section above for details  Person educated: Patient Education method: Verbal Instruction, Teach back, Handouts  Education comprehension: States and demonstrates understanding, Additional Education required    HOME EXERCISE PROGRAM: Access Code: [email or web address] URL: [email or web address] Date: 08/23/2022 Prepared by: Benito Mccreedy   GOALS: Goals reviewed with patient? Yes   SHORT TERM GOALS: (STG required if POC>30 days) Target Date: 09/07/22  Pt will obtain protective, custom orthotic. Goal status: 09/11/22: MET  2.  Pt will demo/state understanding of initial HEP to improve pain levels and prerequisite motion. Goal status: Met 09/04/22   LONG TERM GOALS: Target Date: 10/05/22  Pt will improve functional ability by decreased impairment per Quick DASH assessment from 75% impaired to 30% impaired or better, for better quality of life. Goal status: INITIAL  2.  Pt will improve grip strength in Lt hand from unable to at least 40lbs for functional use at home and in IADLs. Goal status: INITIAL  3.  Pt will improve A/ROM in Lt wrist flex/ext from 59*/34* to at least 65* both ways, to have functional motion for tasks like reach and grasp.  Goal status: INITIAL  4.  Pt will improve strength in Lt Wrist flex/ext from guarded and not using to at least 4/5 MMT to have increased functional ability to carry out selfcare and higher-level homecare tasks with no difficulty. Goal status: INITIAL  5.  Pt will improve coordination skills in Lt arm, as seen by [facility] score on box and blocks testing to have increased functional ability to carry out fine motor tasks (fasteners, etc.) and more complex, coordinated IADLs (meal prep, sports, etc.).  Goal status: INITIAL  6.  Pt will decrease pain at worst from  8/10 to 2/10 or better to have better sleep and occupational participation in daily roles. Goal status: INITIAL   ASSESSMENT:  CLINICAL IMPRESSION: 09/25/21: ***  09/11/22: Continue  to work toward motion and functional strength and ability with left hand and arm per plan of care. Unfortunately, if this is a pure nerve injury, conservative management (orthotic mobilization, scar mobilization, desensitization, exercises, etc.) may not be effective at helping him regain neuromuscular nerve activation of the median nerve- and it may require surgical intervention to free the nerve from the scar in forearm or other approaches.   PLAN:  OT FREQUENCY: 1-2x/week  OT DURATION: 6 weeks (through 10/05/22 as needed)   PLANNED INTERVENTIONS: self care/ADL training, therapeutic exercise, therapeutic activity, neuromuscular re-education, manual therapy, scar mobilization, passive range of motion, splinting, electrical stimulation, ultrasound, fluidotherapy, compression bandaging, moist heat, cryotherapy, contrast bath, patient/family education, and coping strategies training  CONSULTED AND AGREED WITH PLAN OF CARE: Patient  PLAN FOR NEXT SESSION:  ***   Fannie Knee, OTR/L, CHT 09/18/2022, 3:29 PM

## 2022-09-25 ENCOUNTER — Encounter: Payer: BLUE CROSS/BLUE SHIELD | Admitting: Rehabilitative and Restorative Service Providers"

## 2022-09-25 NOTE — Progress Notes (Signed)
This encounter was created in error - please disregard.

## 2022-10-03 ENCOUNTER — Other Ambulatory Visit: Payer: Commercial Managed Care - HMO

## 2022-10-09 ENCOUNTER — Ambulatory Visit: Payer: Commercial Managed Care - HMO | Admitting: Orthopaedic Surgery

## 2023-02-01 DIAGNOSIS — S472XXD Crushing injury of left shoulder and upper arm, subsequent encounter: Secondary | ICD-10-CM | POA: Diagnosis not present

## 2023-02-01 DIAGNOSIS — M25642 Stiffness of left hand, not elsewhere classified: Secondary | ICD-10-CM | POA: Diagnosis not present

## 2023-02-01 DIAGNOSIS — R29898 Other symptoms and signs involving the musculoskeletal system: Secondary | ICD-10-CM | POA: Diagnosis not present

## 2023-02-01 DIAGNOSIS — R202 Paresthesia of skin: Secondary | ICD-10-CM | POA: Diagnosis not present

## 2023-02-01 DIAGNOSIS — R200 Anesthesia of skin: Secondary | ICD-10-CM | POA: Diagnosis not present

## 2023-02-06 DIAGNOSIS — S472XXD Crushing injury of left shoulder and upper arm, subsequent encounter: Secondary | ICD-10-CM | POA: Diagnosis not present

## 2023-02-06 DIAGNOSIS — R200 Anesthesia of skin: Secondary | ICD-10-CM | POA: Diagnosis not present

## 2023-02-06 DIAGNOSIS — M25642 Stiffness of left hand, not elsewhere classified: Secondary | ICD-10-CM | POA: Diagnosis not present

## 2023-02-06 DIAGNOSIS — R29898 Other symptoms and signs involving the musculoskeletal system: Secondary | ICD-10-CM | POA: Diagnosis not present

## 2023-02-06 DIAGNOSIS — R202 Paresthesia of skin: Secondary | ICD-10-CM | POA: Diagnosis not present

## 2023-10-09 NOTE — H&P (Signed)
  HPI:   Adam Jacobs is a 63 y.o. male who presents as a consult Patient.   Referring Provider: Marvis Repress, NP  Chief complaint: Neck mass.  HPI: Fairly healthy gentleman, originally from [place]. He has had a soft left anterior neck mass for 20+ years. Its gotten larger over the years. He had an ultrasound several years ago which revealed findings consistent with lipoma. Otherwise healthy. No symptoms related to this.  PMH/Meds/All/SocHx/FamHx/ROS:   History reviewed. No pertinent past medical history.  History reviewed. No pertinent surgical history.  No family history of bleeding disorders, wound healing problems or difficulty with anesthesia.     Current Outpatient Medications:  acetaminophen (TYLENOL) 325 mg tablet, Take 650 mg by mouth., Disp: , Rfl:  gabapentin (NEURONTIN) 400 mg capsule, Take 300 mg by mouth., Disp: , Rfl:  HYDROcodone-acetaminophen (NORCO) 5-325 mg per tablet, Take 1-2 tablets by mouth every 4 (four) hours as needed., Disp: , Rfl:  ibuprofen (MOTRIN) 200 mg tablet, Take 600 mg by mouth 3 (three) times a day as needed., Disp: , Rfl:  rosuvastatin (CRESTOR) 10 mg tablet, , Disp: , Rfl:   A complete ROS was performed with pertinent positives/negatives noted in the HPI. The remainder of the ROS are negative.   Physical Exam:   Temp 97.2 F (36.2 C) (Temporal)  Ht 1.65 m (5' 4.96")  Wt 63.5 kg (140 lb)  BMI 23.33 kg/m   General: Healthy and alert, in no distress, breathing easily. Normal affect. In a pleasant mood. Head: Normocephalic, atraumatic. No masses, or scars. Eyes: Pupils are equal, and reactive to light. Vision is grossly intact. No spontaneous or gaze nystagmus. Ears: Ear canals are clear. Tympanic membranes are intact, with normal landmarks and the middle ears are clear and healthy. Hearing: Grossly normal. Nose: Nasal cavities are clear with healthy mucosa, no polyps or exudate. Airways are patent. Face: No masses or scars,  facial nerve function is symmetric. Oral Cavity: No mucosal abnormalities are noted. Tongue with normal mobility. Dentition appears healthy. Oropharynx: Tonsils are symmetric. There are no mucosal masses identified. Tongue base appears normal and healthy. Larynx/Hypopharynx: deferred Chest: Deferred Neck: 8 or 9 cm soft lipomatous mass involving the left submandibular region, otherwise no palpable masses, no cervical adenopathy, no thyroid nodules or enlargement. Neuro: Cranial nerves II-XII with normal function. Balance: Normal gate. Other findings: none.  Independent Review of Additional Tests or Records:  Ultrasound from 2020:  IMPRESSION:  In the patient's palpable area of concern in the left submandibular  region there is a 7.1 cm heterogeneous hypovascular mass. The  appearance is more consistent with a lipoma then glandular tissue.  If there is interval growth, follow-up with a contrast-enhanced CT  or MRI is recommended for further evaluation.   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 Recommend CT imaging to further evaluate the local anatomy and then we will discuss surgical resection if he is interested.  On further review of his records he had a CT scan recently. I have reviewed this. It does look consistent with lipoma. We will discuss surgical treatment and schedule accordingly.

## 2023-10-15 ENCOUNTER — Other Ambulatory Visit: Payer: Self-pay

## 2023-10-15 ENCOUNTER — Encounter (HOSPITAL_COMMUNITY): Payer: Self-pay | Admitting: Otolaryngology

## 2023-10-15 NOTE — Progress Notes (Signed)
PCP - denies Cardiologist - denies  PPM/ICD - denies   Chest x-ray - 04/29/16 EKG - 05/27/22-CE (n/a) Stress Test - denies ECHO - denies Cardiac Cath - denies  CPAP - denies  DM- denies  ASA/Blood Thinner Instructions: n/a   ERAS Protcol - no, NPO  COVID TEST- n/a  Anesthesia review: no  Patient verbally denies any shortness of breath, fever, cough and chest pain during phone call      Questions were answered. Patient verbalized understanding of instructions.   Pt was not sure if he has someone to be with him for 24 hours after anesthesia. Pt was advised to call us back ASAP if he cannot find anyone so that we can let the surgeon's office and the anesthesia team know. Pt also had difficulty understanding me multiple times and I had to repeat myself. I asked the pt if he would like for me to call back with an interpreter. He refused. I asked if he would like an interpreter for surgery tomorrow. He refused. Interpreter waiver placed in chart

## 2023-10-15 NOTE — Anesthesia Preprocedure Evaluation (Signed)
Anesthesia Evaluation  Patient identified by MRN, date of birth, ID band Patient awake    Reviewed: Allergy & Precautions, H&P , NPO status , Patient's Chart, lab work & pertinent test results  Airway Mallampati: II  TM Distance: >3 FB Neck ROM: Full    Dental no notable dental hx. (+) Teeth Intact, Dental Advisory Given   Pulmonary neg pulmonary ROS, former smoker   Pulmonary exam normal breath sounds clear to auscultation       Cardiovascular Exercise Tolerance: Good negative cardio ROS  Rhythm:Regular Rate:Normal     Neuro/Psych negative neurological ROS  negative psych ROS   GI/Hepatic negative GI ROS, Neg liver ROS,,,  Endo/Other  negative endocrine ROS    Renal/GU negative Renal ROS  negative genitourinary   Musculoskeletal   Abdominal   Peds  Hematology negative hematology ROS (+)   Anesthesia Other Findings   Reproductive/Obstetrics negative OB ROS                             Anesthesia Physical Anesthesia Plan  ASA: 2  Anesthesia Plan: General   Post-op Pain Management: Tylenol PO (pre-op)*   Induction: Intravenous  PONV Risk Score and Plan: 3 and Ondansetron, Dexamethasone and Midazolam  Airway Management Planned: Oral ETT and Video Laryngoscope Planned  Additional Equipment:   Intra-op Plan:   Post-operative Plan: Extubation in OR  Informed Consent: I have reviewed the patients History and Physical, chart, labs and discussed the procedure including the risks, benefits and alternatives for the proposed anesthesia with the patient or authorized representative who has indicated his/her understanding and acceptance.     Dental advisory given  Plan Discussed with: CRNA  Anesthesia Plan Comments:        Anesthesia Quick Evaluation

## 2023-10-15 NOTE — Progress Notes (Signed)
Spoke with the pt, he will arrive tom at 0630. NPO post midnight.

## 2023-10-15 NOTE — Progress Notes (Signed)
Patient called short stay stating he found a ride home.  I reiterated that he must have a ride home and someone to stay with him for 24 hours.  Patient verbalized understanding.

## 2023-10-16 ENCOUNTER — Encounter (HOSPITAL_COMMUNITY): Payer: Self-pay | Admitting: Otolaryngology

## 2023-10-16 ENCOUNTER — Ambulatory Visit (HOSPITAL_COMMUNITY): Payer: No Typology Code available for payment source | Admitting: Certified Registered Nurse Anesthetist

## 2023-10-16 ENCOUNTER — Encounter (HOSPITAL_COMMUNITY)
Admission: RE | Disposition: A | Discharge status: Home / Self Care | Payer: Self-pay | Source: Home / Self Care | Attending: Otolaryngology

## 2023-10-16 ENCOUNTER — Observation Stay (HOSPITAL_COMMUNITY)
Admission: RE | Admit: 2023-10-16 | Discharge: 2023-10-17 | Disposition: A | Discharge status: Home / Self Care | Payer: No Typology Code available for payment source | Attending: Otolaryngology | Admitting: Otolaryngology

## 2023-10-16 ENCOUNTER — Other Ambulatory Visit: Payer: Self-pay

## 2023-10-16 ENCOUNTER — Ambulatory Visit (HOSPITAL_BASED_OUTPATIENT_CLINIC_OR_DEPARTMENT_OTHER): Payer: No Typology Code available for payment source | Admitting: Certified Registered Nurse Anesthetist

## 2023-10-16 DIAGNOSIS — R220 Localized swelling, mass and lump, head: Principal | ICD-10-CM | POA: Insufficient documentation

## 2023-10-16 DIAGNOSIS — R221 Localized swelling, mass and lump, neck: Secondary | ICD-10-CM | POA: Diagnosis not present

## 2023-10-16 HISTORY — PX: EXCISION MASS NECK: SHX6703

## 2023-10-16 LAB — BASIC METABOLIC PANEL
Anion gap: 12 (ref 5–15)
BUN: 24 mg/dL — ABNORMAL HIGH (ref 8–23)
CO2: 24 mmol/L (ref 22–32)
Calcium: 9.5 mg/dL (ref 8.9–10.3)
Chloride: 103 mmol/L (ref 98–111)
Creatinine, Ser: 1.09 mg/dL (ref 0.61–1.24)
GFR, Estimated: >60 mL/min (ref >60)
Glucose, Bld: 103 mg/dL — ABNORMAL HIGH (ref 70–99)
Potassium: 4 mmol/L (ref 3.5–5.1)
Sodium: 139 mmol/L (ref 135–145)

## 2023-10-16 LAB — CBC
HCT: 42.8 % (ref 39.0–52.0)
Hemoglobin: 14 g/dL (ref 13.0–17.0)
MCH: 29.4 pg (ref 26.0–34.0)
MCHC: 32.7 g/dL (ref 30.0–36.0)
MCV: 89.9 fL (ref 80.0–100.0)
Platelets: 243 10*3/uL (ref 150–400)
RBC: 4.76 MIL/uL (ref 4.22–5.81)
RDW: 13 % (ref 11.5–15.5)
WBC: 4.7 10*3/uL (ref 4.0–10.5)
nRBC: 0 % (ref 0.0–0.2)

## 2023-10-16 SURGERY — EXCISION, MASS, NECK
Anesthesia: General | Site: Neck | Laterality: Left

## 2023-10-16 MED ORDER — DEXTROSE-SODIUM CHLORIDE 5-0.9 % IV SOLN: INTRAVENOUS | Status: AC

## 2023-10-16 MED ORDER — HYDROMORPHONE HCL 1 MG/ML IJ SOLN
INTRAMUSCULAR | Status: AC
  Filled 2023-10-16: qty 1

## 2023-10-16 MED ORDER — HYDROMORPHONE HCL 1 MG/ML IJ SOLN
0.2500 mg | INTRAMUSCULAR | Status: DC | PRN
  Administered 2023-10-16 (×3): 0.5 mg via INTRAVENOUS

## 2023-10-16 MED ORDER — ROCURONIUM BROMIDE 10 MG/ML (PF) SYRINGE
PREFILLED_SYRINGE | INTRAVENOUS | Status: DC | PRN
  Administered 2023-10-16: 20 mg via INTRAVENOUS

## 2023-10-16 MED ORDER — PHENYLEPHRINE 80 MCG/ML (10ML) SYRINGE FOR IV PUSH (FOR BLOOD PRESSURE SUPPORT)
PREFILLED_SYRINGE | INTRAVENOUS | Status: AC
  Filled 2023-10-16: qty 10

## 2023-10-16 MED ORDER — PROPOFOL 10 MG/ML IV BOLUS
INTRAVENOUS | Status: AC
Start: 2023-10-16 — End: ?
  Filled 2023-10-16: qty 20

## 2023-10-16 MED ORDER — ACETAMINOPHEN 500 MG PO TABS
1000.0000 mg | ORAL_TABLET | Freq: Once | ORAL | Status: AC
  Administered 2023-10-16: 1000 mg via ORAL
  Filled 2023-10-16: qty 2

## 2023-10-16 MED ORDER — LIDOCAINE-EPINEPHRINE 1 %-1:100000 IJ SOLN
INTRAMUSCULAR | Status: AC
  Filled 2023-10-16: qty 1

## 2023-10-16 MED ORDER — LIDOCAINE 2% (20 MG/ML) 5 ML SYRINGE
INTRAMUSCULAR | Status: AC
  Filled 2023-10-16: qty 5

## 2023-10-16 MED ORDER — SUCCINYLCHOLINE CHLORIDE 200 MG/10ML IV SOSY
PREFILLED_SYRINGE | INTRAVENOUS | Status: AC
  Filled 2023-10-16: qty 10

## 2023-10-16 MED ORDER — FENTANYL CITRATE (PF) 250 MCG/5ML IJ SOLN
INTRAMUSCULAR | Status: AC
  Filled 2023-10-16: qty 5

## 2023-10-16 MED ORDER — LACTATED RINGERS IV SOLN
INTRAVENOUS | Status: DC
Start: 2023-10-16 — End: 2023-10-16

## 2023-10-16 MED ORDER — ORAL CARE MOUTH RINSE: 15.0000 mL | Freq: Once | OROMUCOSAL | Status: AC

## 2023-10-16 MED ORDER — DEXMEDETOMIDINE HCL IN NACL 80 MCG/20ML IV SOLN
INTRAVENOUS | Status: DC | PRN
  Administered 2023-10-16: 8 ug via INTRAVENOUS

## 2023-10-16 MED ORDER — ROCURONIUM BROMIDE 10 MG/ML (PF) SYRINGE
PREFILLED_SYRINGE | INTRAVENOUS | Status: AC
  Filled 2023-10-16: qty 10

## 2023-10-16 MED ORDER — DEXAMETHASONE SODIUM PHOSPHATE 10 MG/ML IJ SOLN
INTRAMUSCULAR | Status: AC
  Filled 2023-10-16: qty 1

## 2023-10-16 MED ORDER — IBUPROFEN 100 MG/5ML PO SUSP: 400.0000 mg | Freq: Four times a day (QID) | ORAL | Status: DC | PRN

## 2023-10-16 MED ORDER — PROPOFOL 10 MG/ML IV BOLUS
INTRAVENOUS | Status: DC | PRN
  Administered 2023-10-16: 110 mg via INTRAVENOUS

## 2023-10-16 MED ORDER — ONDANSETRON HCL 4 MG/2ML IJ SOLN
INTRAMUSCULAR | Status: AC
  Filled 2023-10-16: qty 2

## 2023-10-16 MED ORDER — SUGAMMADEX SODIUM 200 MG/2ML IV SOLN
INTRAVENOUS | Status: DC | PRN
  Administered 2023-10-16: 200 mg via INTRAVENOUS

## 2023-10-16 MED ORDER — FENTANYL CITRATE (PF) 250 MCG/5ML IJ SOLN
INTRAMUSCULAR | Status: DC | PRN
  Administered 2023-10-16: 100 ug via INTRAVENOUS

## 2023-10-16 MED ORDER — SUGAMMADEX SODIUM 200 MG/2ML IV SOLN
INTRAVENOUS | Status: AC
  Filled 2023-10-16: qty 2

## 2023-10-16 MED ORDER — DEXAMETHASONE SODIUM PHOSPHATE 10 MG/ML IJ SOLN
INTRAMUSCULAR | Status: AC
Start: 2023-10-16 — End: ?
  Filled 2023-10-16: qty 1

## 2023-10-16 MED ORDER — ONDANSETRON HCL 4 MG/2ML IJ SOLN
INTRAMUSCULAR | Status: DC | PRN
  Administered 2023-10-16: 4 mg via INTRAVENOUS

## 2023-10-16 MED ORDER — 0.9 % SODIUM CHLORIDE (POUR BTL) OPTIME
TOPICAL | Status: DC | PRN
Start: 2023-10-16 — End: 2023-10-16
  Administered 2023-10-16: 1000 mL

## 2023-10-16 MED ORDER — MIDAZOLAM HCL 2 MG/2ML IJ SOLN
INTRAMUSCULAR | Status: AC
  Filled 2023-10-16: qty 2

## 2023-10-16 MED ORDER — BACITRACIN ZINC 500 UNIT/GM EX OINT
TOPICAL_OINTMENT | CUTANEOUS | Status: AC
Start: 2023-10-16 — End: ?
  Filled 2023-10-16: qty 28.35

## 2023-10-16 MED ORDER — NAPROXEN 250 MG PO TABS
250.0000 mg | ORAL_TABLET | Freq: Three times a day (TID) | ORAL | Status: DC
  Administered 2023-10-16 – 2023-10-17 (×2): 250 mg via ORAL
  Filled 2023-10-16 (×2): qty 1

## 2023-10-16 MED ORDER — CHLORHEXIDINE GLUCONATE 0.12 % MT SOLN
15.0000 mL | Freq: Once | OROMUCOSAL | Status: AC
  Administered 2023-10-16: 15 mL via OROMUCOSAL
  Filled 2023-10-16: qty 15

## 2023-10-16 MED ORDER — EPHEDRINE 5 MG/ML INJ
INTRAVENOUS | Status: AC
  Filled 2023-10-16: qty 5

## 2023-10-16 MED ORDER — ROSUVASTATIN CALCIUM 5 MG PO TABS
10.0000 mg | ORAL_TABLET | Freq: Every day | ORAL | Status: DC
  Administered 2023-10-16 – 2023-10-17 (×2): 10 mg via ORAL
  Filled 2023-10-16 (×2): qty 2

## 2023-10-16 MED ORDER — LIDOCAINE 2% (20 MG/ML) 5 ML SYRINGE
INTRAMUSCULAR | Status: DC | PRN
Start: 2023-10-16 — End: 2023-10-16
  Administered 2023-10-16: 60 mg via INTRAVENOUS

## 2023-10-16 MED ORDER — DEXAMETHASONE SODIUM PHOSPHATE 10 MG/ML IJ SOLN
INTRAMUSCULAR | Status: DC | PRN
  Administered 2023-10-16: 5 mg via INTRAVENOUS

## 2023-10-16 MED ORDER — SUCCINYLCHOLINE CHLORIDE 200 MG/10ML IV SOSY
PREFILLED_SYRINGE | INTRAVENOUS | Status: DC | PRN
  Administered 2023-10-16: 80 mg via INTRAVENOUS

## 2023-10-16 MED ORDER — HYDROCODONE-ACETAMINOPHEN 5-325 MG PO TABS
1.0000 | ORAL_TABLET | ORAL | Status: DC | PRN
  Administered 2023-10-16: 2 via ORAL
  Filled 2023-10-16 (×2): qty 2

## 2023-10-16 SURGICAL SUPPLY — 50 items
APPLIER CLIP 9.375 SM OPEN (CLIP)
BAG COUNTER SPONGE SURGICOUNT (BAG) ×1 IMPLANT
BLADE SURG 15 STRL LF DISP TIS (BLADE) IMPLANT
CANISTER SUCT 3000ML PPV (MISCELLANEOUS) ×1 IMPLANT
CLEANER TIP ELECTROSURG 2X2 (MISCELLANEOUS) ×1 IMPLANT
CLIP APPLIE 9.375 SM OPEN (CLIP) IMPLANT
CNTNR URN SCR LID CUP LEK RST (MISCELLANEOUS) IMPLANT
CORD BIPOLAR FORCEPS 12FT (ELECTRODE) ×1 IMPLANT
COVER SURGICAL LIGHT HANDLE (MISCELLANEOUS) ×1 IMPLANT
DERMABOND ADVANCED .7 DNX12 (GAUZE/BANDAGES/DRESSINGS) IMPLANT
DRAIN CHANNEL 15F RND FF W/TCR (WOUND CARE) IMPLANT
DRAIN JP 10F RND RADIO (DRAIN) IMPLANT
DRAIN PENROSE 12X.25 LTX STRL (MISCELLANEOUS) IMPLANT
DRAPE HALF SHEET 40X57 (DRAPES) IMPLANT
DRAPE INCISE 23X17 STRL (DRAPES) IMPLANT
DRAPE INCISE IOBAN 23X17 STRL (DRAPES)
ELECT COATED BLADE 2.86 ST (ELECTRODE) ×1 IMPLANT
ELECT REM PT RETURN 9FT ADLT (ELECTROSURGICAL) ×1
ELECTRODE REM PT RTRN 9FT ADLT (ELECTROSURGICAL) ×1 IMPLANT
EVACUATOR SILICONE 100CC (DRAIN) ×1 IMPLANT
FORCEPS BIPOLAR SPETZLER 8 1.0 (NEUROSURGERY SUPPLIES) IMPLANT
GAUZE 4X4 16PLY ~~LOC~~+RFID DBL (SPONGE) IMPLANT
GAUZE SPONGE 4X4 12PLY STRL (GAUZE/BANDAGES/DRESSINGS) IMPLANT
GLOVE ECLIPSE 7.5 STRL STRAW (GLOVE) ×1 IMPLANT
GOWN STRL REUS W/ TWL LRG LVL3 (GOWN DISPOSABLE) ×2 IMPLANT
KIT BASIN OR (CUSTOM PROCEDURE TRAY) ×1 IMPLANT
KIT TURNOVER KIT B (KITS) ×1 IMPLANT
LOCATOR NERVE 3 VOLT (DISPOSABLE) IMPLANT
NDL PRECISIONGLIDE 27X1.5 (NEEDLE) ×1 IMPLANT
NEEDLE PRECISIONGLIDE 27X1.5 (NEEDLE) ×2
NS IRRIG 1000ML POUR BTL (IV SOLUTION) ×1 IMPLANT
PAD ARMBOARD 7.5X6 YLW CONV (MISCELLANEOUS) ×2 IMPLANT
PENCIL FOOT CONTROL (ELECTRODE) ×1 IMPLANT
SPECIMEN JAR MEDIUM (MISCELLANEOUS) IMPLANT
SPONGE INTESTINAL PEANUT (DISPOSABLE) IMPLANT
SPONGE T-LAP 18X18 ~~LOC~~+RFID (SPONGE) IMPLANT
STAPLER VISISTAT 35W (STAPLE) ×1 IMPLANT
SUT CHROMIC 3 0 SH 27 (SUTURE) IMPLANT
SUT CHROMIC 5 0 P 3 (SUTURE) IMPLANT
SUT ETHILON 3 0 PS 1 (SUTURE) IMPLANT
SUT ETHILON 5 0 PS 2 18 (SUTURE) IMPLANT
SUT SILK 2 0 REEL (SUTURE) IMPLANT
SUT SILK 3 0 SH CR/8 (SUTURE) IMPLANT
SUT SILK 4 0 REEL (SUTURE) IMPLANT
SUT VIC AB 3-0 SH 18 (SUTURE) IMPLANT
TOWEL GREEN STERILE FF (TOWEL DISPOSABLE) ×1 IMPLANT
TRAY ENT MC OR (CUSTOM PROCEDURE TRAY) ×1 IMPLANT
TRAY FOLEY MTR SLVR 14FR STAT (SET/KITS/TRAYS/PACK) IMPLANT
TUBE 10FR 43IN 5G WT TIP ENFIT (TUBING) IMPLANT
WATER STERILE IRR 1000ML POUR (IV SOLUTION) IMPLANT

## 2023-10-16 NOTE — Op Note (Signed)
OPERATIVE REPORT  DATE OF SURGERY: 10/16/2023  PATIENT:  Jadarian Leder,  63 y.o. male  PRE-OPERATIVE DIAGNOSIS:  Neck mass, left  POST-OPERATIVE DIAGNOSIS:  Neck mass, left  PROCEDURE:  Procedure(s): EXCISION MASS NECK, left  SURGEON:  Susy Frizzle, MD  ASSISTANTS: None  ANESTHESIA:   General   EBL: 10 ml  DRAINS: Quarter-inch Penrose  LOCAL MEDICATIONS USED:  None  SPECIMEN: Left neck mass  COUNTS:  Correct  PROCEDURE DETAILS: The patient was taken to the operating room and placed on the operating table in the supine position. Following induction of general endotracheal anesthesia, the left neck was prepped and draped in a standard fashion.  A transverse incision was outlined with a marking pen about 2 fingerbreadths below the mandible.  Electrocautery was used to incise the skin and subcutaneous tissue and platysma layer.  The lipomatous mass was immediately identified and dissected free of surrounding tissue.  Facial nerve branches were preserved with their surrounding fibrofatty tissue.  The entire lipoma was removed and sent for pathologic evaluation.  Bipolar cautery was used for completion of hemostasis.  The drain was left in the wound and exited through the posterior incision.  Platysma layer was reapproximated with interrupted 3-0 chromic.  A running subcuticular 3-0 chromic closure was accomplished.  Dermabond was used on the skin.  A dressing was applied.  Patient was awakened extubated and transferred to recovery in stable condition.    PATIENT DISPOSITION:  To PACU, stable

## 2023-10-16 NOTE — Anesthesia Postprocedure Evaluation (Signed)
Anesthesia Post Note  Patient: Adam Jacobs  Procedure(s) Performed: EXCISION MASS NECK (Left: Neck)     Patient location during evaluation: PACU Anesthesia Type: General Level of consciousness: awake and alert Pain management: pain level controlled Vital Signs Assessment: post-procedure vital signs reviewed and stable Respiratory status: spontaneous breathing, nonlabored ventilation and respiratory function stable Cardiovascular status: blood pressure returned to baseline and stable Postop Assessment: no apparent nausea or vomiting Anesthetic complications: no  No notable events documented.  Last Vitals:  Vitals:   10/16/23 1015 10/16/23 1030  BP: (!) 160/95 (!) 153/86  Pulse: 67 66  Resp: 12 10  Temp:    SpO2: 99% 100%    Last Pain:  Vitals:   10/16/23 1015  TempSrc:   PainSc: 6                  Aasir Daigler,W. EDMOND

## 2023-10-16 NOTE — Interval H&P Note (Signed)
History and Physical Interval Note:  10/16/2023 8:16 AM  Adam Jacobs  has presented today for surgery, with the diagnosis of Neck mass.  The various methods of treatment have been discussed with the patient and family. After consideration of risks, benefits and other options for treatment, the patient has consented to  Procedure(s): EXCISION MASS NECK (Left) as a surgical intervention.  The patient's history has been reviewed, patient examined, no change in status, stable for surgery.  I have reviewed the patient's chart and labs.  Questions were answered to the patient's satisfaction.     Serena Colonel

## 2023-10-16 NOTE — Transfer of Care (Signed)
Immediate Anesthesia Transfer of Care Note  Patient: Adam Jacobs  Procedure(s) Performed: EXCISION MASS NECK (Left: Neck)  Patient Location: PACU  Anesthesia Type:General  Level of Consciousness: sedated  Airway & Oxygen Therapy: Patient Spontanous Breathing  Post-op Assessment: Report given to RN  Post vital signs: Reviewed and stable  Last Vitals:  Vitals Value Taken Time  BP 155/86 10/16/23 0930  Temp    Pulse 70 10/16/23 0932  Resp 11 10/16/23 0932  SpO2 98 % 10/16/23 0932  Vitals shown include unfiled device data.  Last Pain:  Vitals:   10/16/23 0926  TempSrc:   PainSc: Asleep         Complications: No notable events documented.

## 2023-10-16 NOTE — Plan of Care (Signed)
   Problem: Education: Goal: Knowledge of General Education information will improve Description: Including pain rating scale, medication(s)/side effects and non-pharmacologic comfort measures Outcome: Progressing   Problem: Health Behavior/Discharge Planning: Goal: Ability to manage health-related needs will improve Outcome: Progressing   Problem: Clinical Measurements: Goal: Ability to maintain clinical measurements within normal limits will improve Outcome: Progressing Goal: Will remain free from infection Outcome: Progressing Goal: Diagnostic test results will improve Outcome: Progressing Goal: Respiratory complications will improve Outcome: Progressing Goal: Cardiovascular complication will be avoided Outcome: Progressing   Problem: Activity: Goal: Risk for activity intolerance will decrease Outcome: Progressing   Problem: Nutrition: Goal: Adequate nutrition will be maintained Outcome: Progressing   Problem: Coping: Goal: Level of anxiety will decrease Outcome: Progressing   Problem: Elimination: Goal: Will not experience complications related to bowel motility Outcome: Progressing Goal: Will not experience complications related to urinary retention Outcome: Progressing   Problem: Pain Managment: Goal: General experience of comfort will improve and/or be controlled Outcome: Progressing   Problem: Safety: Goal: Ability to remain free from injury will improve Outcome: Progressing   Problem: Skin Integrity: Goal: Risk for impaired skin integrity will decrease Outcome: Progressing   Problem: Education: Goal: Knowledge of the prescribed therapeutic regimen will improve Outcome: Progressing   Problem: Activity: Goal: Ability to tolerate increased activity will improve Outcome: Progressing   Problem: Health Behavior/Discharge Planning: Goal: Identification of resources available to assist in meeting health care needs will improve Outcome: Progressing    Problem: Nutrition: Goal: Maintenance of adequate nutrition will improve Outcome: Progressing   Problem: Clinical Measurements: Goal: Complications related to the disease process, condition or treatment will be avoided or minimized Outcome: Progressing   Problem: Respiratory: Goal: Will regain and/or maintain adequate ventilation Outcome: Progressing   Problem: Skin Integrity: Goal: Demonstration of wound healing without infection will improve Outcome: Progressing

## 2023-10-16 NOTE — Anesthesia Procedure Notes (Signed)
Procedure Name: Intubation Date/Time: 10/16/2023 8:52 AM  Performed by: Bartholomew Crews, CRNAPre-anesthesia Checklist: Patient identified, Suction available, Emergency Drugs available, Patient being monitored and Timeout performed Oxygen Delivery Method: Circle system utilized Preoxygenation: Pre-oxygenation with 100% oxygen Induction Type: IV induction Ventilation: Mask ventilation without difficulty Laryngoscope Size: Glidescope and 3 Grade View: Grade I Tube type: Oral Number of attempts: 1 Airway Equipment and Method: Stylet and Video-laryngoscopy Placement Confirmation: ETT inserted through vocal cords under direct vision, positive ETCO2 and breath sounds checked- equal and bilateral Secured at: 21 cm Tube secured with: Tape Dental Injury: Teeth and Oropharynx as per pre-operative assessment

## 2023-10-16 NOTE — Progress Notes (Signed)
Per pt he has a ride but no one can stay with him for 24 hrs. Per dr. Pollyann Kennedy he will be admitted.  Pt will need a Child psychotherapist consult at the discharge; states he will not be able to afford any prescribed meds since he is unemployed. Dr. Pollyann Kennedy is aware.

## 2023-10-17 ENCOUNTER — Encounter (HOSPITAL_COMMUNITY): Payer: Self-pay | Admitting: Otolaryngology

## 2023-10-17 ENCOUNTER — Other Ambulatory Visit (HOSPITAL_COMMUNITY): Payer: Self-pay

## 2023-10-17 DIAGNOSIS — R220 Localized swelling, mass and lump, head: Secondary | ICD-10-CM | POA: Diagnosis not present

## 2023-10-17 LAB — SURGICAL PATHOLOGY

## 2023-10-17 MED ORDER — HYDROCODONE-ACETAMINOPHEN 5-325 MG PO TABS
1.0000 | ORAL_TABLET | ORAL | 0 refills | Status: AC | PRN
  Filled 2023-10-17: qty 16, 3d supply, fill #0

## 2023-10-17 MED ORDER — IBUPROFEN 100 MG/5ML PO SUSP: 400.0000 mg | Freq: Four times a day (QID) | ORAL | Status: AC | PRN

## 2023-10-17 NOTE — Discharge Summary (Signed)
Physician Discharge Summary  Patient ID: Adam Jacobs MRN: [id number] DOB/AGE: 63-Nov-1962 63 y.o.  Admit date: 10/16/2023 Discharge date: 10/17/2023  Admission Diagnoses: Neck lipoma  Discharge Diagnoses:  Principal Problem:   Neck mass   Discharged Condition: good  Hospital Course: No complications  Consults: none  Significant Diagnostic Studies: none  Treatments: surgery: Excision left neck lipoma  Discharge Exam: Blood pressure (!) 155/92, pulse 65, temperature 97.6 F (36.4 C), temperature source Oral, resp. rate 16, height 5\' 6"  (1.676 m), weight 54.4 kg, SpO2 100%. PHYSICAL EXAM: Awake and alert.  Neck looks excellent.  Drain gone.  No swelling.  Disposition: Discharge disposition: 01-Home or Self Care       Discharge Instructions     Diet - low sodium heart healthy   Complete by: As directed    Increase activity slowly   Complete by: As directed       Allergies as of 10/17/2023   No Known Allergies      Medication List     TAKE these medications    HYDROcodone-acetaminophen 5-325 MG tablet Commonly known as: NORCO/VICODIN Take 1 tablet by mouth every 4 (four) hours as needed for up to 16 doses for moderate pain (pain score 4-6).   ibuprofen 100 MG/5ML suspension Commonly known as: ADVIL Take 20 mLs (400 mg total) by mouth every 6 (six) hours as needed (mild pain or temp over 101degrees F).   meloxicam 15 MG tablet Commonly known as: MOBIC Take 1 tablet by mouth daily.   rosuvastatin 10 MG tablet Commonly known as: CRESTOR Take 10 mg by mouth daily.        Follow-up Information     Serena Colonel, MD Follow up in 2 week(s).   Specialty: Otolaryngology Why: As needed Contact information: [address] [place] [address] [phone]                 Signed: Serena Colonel 10/17/2023, 8:54 AM

## 2023-10-17 NOTE — Discharge Instructions (Signed)
Keep the neck clean and dry.  Keep the dressing on as long as there is any drainage.  Once there is no more drainage not necessary to keep the dressing on.  You may shower and use soap and water. Do not use any creams, oils or ointment.

## 2023-10-17 NOTE — Progress Notes (Signed)
Subjective: Doing well, no complaints.  Minimal pain.  Objective: Vital signs in last 24 hours: Temp:  [96.3 F (35.7 C)-98.2 F (36.8 C)] 97.6 F (36.4 C) (02/20 0610) Pulse Rate:  [65-91] 65 (02/20 0610) Resp:  [10-18] 16 (02/20 0610) BP: (132-186)/(78-100) 155/92 (02/20 0610) SpO2:  [99 %-100 %] 100 % (02/20 0610) Weight change:     Intake/Output from previous day: 02/19 0701 - 02/20 0700 In: 1552 [P.O.:720; I.V.:832] Out: 0  Intake/Output this shift: No intake/output data recorded.  PHYSICAL EXAM: Awake and alert.  Neck looks excellent.  Dressing change.  Drain seems to have fallen out.  No swelling.  Lab Results: Recent Labs    10/16/23 0712  WBC 4.7  HGB 14.0  HCT 42.8  PLT 243   BMET Recent Labs    10/16/23 0712  NA 139  K 4.0  CL 103  CO2 24  GLUCOSE 103*  BUN 24*  CREATININE 1.09  CALCIUM 9.5    Studies/Results: No results found.  Medications: I have reviewed the patient's current medications.  Assessment/Plan: Stable postop.  Discharge home.  Follow-up in 1 or 2 weeks or sooner if he has any problems.  LOS: 0 days     Serena Colonel 10/17/2023, 8:52 AM

## 2023-11-29 DIAGNOSIS — G5612 Other lesions of median nerve, left upper limb: Secondary | ICD-10-CM | POA: Diagnosis not present

## 2023-11-29 DIAGNOSIS — G5602 Carpal tunnel syndrome, left upper limb: Secondary | ICD-10-CM | POA: Diagnosis not present

## 2024-02-24 DIAGNOSIS — G5602 Carpal tunnel syndrome, left upper limb: Secondary | ICD-10-CM | POA: Diagnosis not present
